# Patient Record
Sex: Male | Born: 1970
Health system: Southern US, Community
[De-identification: ages and names within clinical notes are randomized; demographics above are authoritative.]

## PROBLEM LIST (undated history)

## (undated) DIAGNOSIS — Z9289 Personal history of other medical treatment: Secondary | ICD-10-CM

## (undated) DIAGNOSIS — N4341 Spermatocele of epididymis, single: Secondary | ICD-10-CM

## (undated) DIAGNOSIS — L309 Dermatitis, unspecified: Secondary | ICD-10-CM

## (undated) DIAGNOSIS — T7840XA Allergy, unspecified, initial encounter: Secondary | ICD-10-CM

## (undated) HISTORY — PX: SKIN BIOPSY: SHX1

## (undated) HISTORY — DX: Spermatocele of epididymis, single: N43.41

## (undated) HISTORY — DX: Dermatitis, unspecified: L30.9

## (undated) HISTORY — PX: OSTEOTOMY METACARPAL / FINGER: SUR983

## (undated) HISTORY — DX: Allergy, unspecified, initial encounter: T78.40XA

## (undated) HISTORY — DX: Personal history of other medical treatment: Z92.89

---
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
## 1998-01-17 ENCOUNTER — Emergency Department (HOSPITAL_COMMUNITY): Admission: EM | Admit: 1998-01-17 | Discharge: 1998-01-17 | Payer: Self-pay | Admitting: Emergency Medicine

## 1998-12-09 ENCOUNTER — Emergency Department: Admission: EM | Admit: 1998-12-09 | Discharge: 1998-12-09 | Payer: Self-pay | Admitting: Emergency Medicine

## 1999-07-30 ENCOUNTER — Emergency Department (HOSPITAL_COMMUNITY): Admission: EM | Admit: 1999-07-30 | Discharge: 1999-07-30 | Payer: Self-pay | Admitting: Emergency Medicine

## 1999-10-13 HISTORY — PX: LACERATION REPAIR: SHX5168

## 2010-10-12 DIAGNOSIS — Z9289 Personal history of other medical treatment: Secondary | ICD-10-CM

## 2010-10-12 HISTORY — DX: Personal history of other medical treatment: Z92.89

## 2011-07-27 ENCOUNTER — Ambulatory Visit (INDEPENDENT_AMBULATORY_CARE_PROVIDER_SITE_OTHER): Payer: 59 | Admitting: Medical

## 2011-07-27 ENCOUNTER — Encounter: Payer: Self-pay | Admitting: Medical

## 2011-07-27 DIAGNOSIS — Z0000 Encounter for general adult medical examination without abnormal findings: Secondary | ICD-10-CM

## 2011-07-27 DIAGNOSIS — R0789 Other chest pain: Secondary | ICD-10-CM

## 2011-07-27 LAB — POCT URINALYSIS DIPSTICK
Bilirubin, UA: NEGATIVE
Blood, UA: NEGATIVE
Glucose, UA: NEGATIVE
Nitrite, UA: NEGATIVE
Spec Grav, UA: 1

## 2011-07-27 NOTE — Patient Instructions (Signed)
Preventative Care for Adults, Male       REGULAR HEALTH EXAMS:  A routine yearly physical is a good way to check in with your primary care provider about your health and preventive screening. It is also an opportunity to share updates about your health and any concerns you have, and receive a thorough all-over exam.   Most health insurance companies pay for at least some preventative services.  Check with your health plan for specific coverages.  WHAT PREVENTATIVE SERVICES DO MEN NEED?  Adult men should have their weight and blood pressure checked regularly.   Men age 35 and older should have their cholesterol levels checked regularly.  Beginning at age 50 and continuing to age 75, men should be screened for colorectal cancer.  Certain people should may need continued testing until age 85.  Other cancer screening may include exams for testicular and prostate cancer.  Updating vaccinations is part of preventative care.  Vaccinations help protect against diseases such as the flu.  Lab tests are generally done as part of preventative care to screen for anemia and blood disorders, to screen for problems with the kidneys and liver, to screen for bladder problems, to check blood sugar, and to check your cholesterol level.  Preventative services generally include counseling about diet, exercise, avoiding tobacco, drugs, excessive alcohol consumption, and sexually transmitted infections.    GENERAL RECOMMENDATIONS FOR GOOD HEALTH:  Healthy diet:  Eat a variety of foods, including fruit, vegetables, animal or vegetable protein, such as meat, fish, chicken, and eggs, or beans, lentils, tofu, and grains, such as rice.  Drink plenty of water daily.  Decrease saturated fat in the diet, avoid lots of red meat, processed foods, sweets, fast foods, and fried foods.  Exercise:  Aerobic exercise helps maintain good heart health. At least 30-40 minutes of moderate-intensity exercise is recommended.  For example, a brisk walk that increases your heart rate and breathing. This should be done on most days of the week.   Find a type of exercise or a variety of exercises that you enjoy so that it becomes a part of your daily life.  Examples are running, walking, swimming, water aerobics, and biking.  For motivation and support, explore group exercise such as aerobic class, spin class, Zumba, Yoga,or  martial arts, etc.    Set exercise goals for yourself, such as a certain weight goal, walk or run in a race such as a 5k walk/run.  Speak to your primary care provider about exercise goals.  Disease prevention:  If you smoke or chew tobacco, find out from your caregiver how to quit. It can literally save your life, no matter how long you have been a tobacco user. If you do not use tobacco, never begin.   Maintain a healthy diet and normal weight. Increased weight leads to problems with blood pressure and diabetes.   The Body Mass Index or BMI is a way of measuring how much of your body is fat. Having a BMI above 27 increases the risk of heart disease, diabetes, hypertension, stroke and other problems related to obesity. Your caregiver can help determine your BMI and based on it develop an exercise and dietary program to help you achieve or maintain this important measurement at a healthful level.  High blood pressure causes heart and blood vessel problems.  Persistent high blood pressure should be treated with medicine if weight loss and exercise do not work.   Fat and cholesterol leaves deposits in your arteries   that can block them. This causes heart disease and vessel disease elsewhere in your body.  If your cholesterol is found to be high, or if you have heart disease or certain other medical conditions, then you may need to have your cholesterol monitored frequently and be treated with medication.   Ask if you should have a stress test if your history suggests this. A stress test is a test done on  a treadmill that looks for heart disease. This test can find disease prior to there being a problem.  Avoid drinking alcohol in excess (more than two drinks per day).  Avoid use of street drugs. Do not share needles with anyone. Ask for professional help if you need assistance or instructions on stopping the use of alcohol, cigarettes, and/or drugs.  Brush your teeth twice a day with fluoride toothpaste, and floss once a day. Good oral hygiene prevents tooth decay and gum disease. The problems can be painful, unattractive, and can cause other health problems. Visit your dentist for a routine oral and dental check up and preventive care every 6-12 months.   Look at your skin regularly.  Use a mirror to look at your back. Notify your caregivers of changes in moles, especially if there are changes in shapes, colors, a size larger than a pencil eraser, an irregular border, or development of new moles.  Safety:  Use seatbelts 100% of the time, whether driving or as a passenger.  Use safety devices such as hearing protection if you work in environments with loud noise or significant background noise.  Use safety glasses when doing any work that could send debris in to the eyes.  Use a helmet if you ride a bike or motorcycle.  Use appropriate safety gear for contact sports.  Talk to your caregiver about gun safety.  Use sunscreen with a SPF (or skin protection factor) of 15 or greater.  Lighter skinned people are at a greater risk of skin cancer. Don't forget to also wear sunglasses in order to protect your eyes from too much damaging sunlight. Damaging sunlight can accelerate cataract formation.   Practice safe sex. Use condoms. Condoms are used for birth control and to help reduce the spread of sexually transmitted infections (or STIs).  Some of the STIs are gonorrhea (the clap), chlamydia, syphilis, trichomonas, herpes, HPV (human papilloma virus) and HIV (human immunodeficiency virus) which causes AIDS.  The herpes, HIV and HPV are viral illnesses that have no cure. These can result in disability, cancer and death.   Keep carbon monoxide and smoke detectors in your home functioning at all times. Change the batteries every 6 months or use a model that plugs into the wall.   Vaccinations:  Stay up to date with your tetanus shots and other required immunizations. You should have a booster for tetanus every 10 years. Be sure to get your flu shot every year, since 5%-20% of the [place] population comes down with the flu. The flu vaccine changes each year, so being vaccinated once is not enough. Get your shot in the fall, before the flu season peaks.   Other vaccines to consider:  Pneumococcal vaccine to protect against certain types of pneumonia.  This is normally recommended for adults age 65 or older.  However, adults younger than 38 years old with certain underlying conditions such as diabetes, heart or lung disease should also receive the vaccine.  Shingles vaccine to protect against Varicella Zoster if you are older than age 60, or younger   than 38 years old with certain underlying illness.  Hepatitis A vaccine to protect against a form of infection of the liver by a virus acquired from food.  Hepatitis B vaccine to protect against a form of infection of the liver by a virus acquired from blood or body fluids, particularly if you work in health care.  If you plan to travel internationally, check with your local health department for specific vaccination recommendations.  Cancer Screening:  Most routine colon cancer screening begins at the age of 50. On a yearly basis, doctors may provide special easy to use take-home tests to check for hidden blood in the stool. Sigmoidoscopy or colonoscopy can detect the earliest forms of colon cancer and is life saving. These tests use a small camera at the end of a tube to directly examine the colon. Speak to your caregiver about this at age 50, when routine  screening begins (and is repeated every 5 years unless early forms of pre-cancerous polyps or small growths are found).   At the age of 50 men usually start screening for prostate cancer every year. Screening may begin at a younger age for those with higher risk. Those at higher risk include African-Americans or having a family history of prostate cancer. There are two types of tests for prostate cancer:   Prostate-specific antigen (PSA) testing. Recent studies raise questions about prostate cancer using PSA and you should discuss this with your caregiver.   Digital rectal exam (in which your doctor's lubricated and gloved finger feels for enlargement of the prostate through the anus).   Screening for testicular cancer.  Do a monthly exam of your testicles. Gently roll each testicle between your thumb and fingers, feeling for any abnormal lumps. The best time to do this is after a hot shower or bath when the tissues are looser. Notify your caregivers of any lumps, tenderness or changes in size or shape immediately.     

## 2011-07-27 NOTE — Progress Notes (Signed)
Subjective:   HPI  Oscar Ellis is a 40 y.o. male who presents for a complete physical.  He is a new patient today.  In general has been in normal state of health.  He  recently had a flu vaccine through work, and last Tdap 2010.  Last physical was several years ago.  He has primarily went to urgent care for medical issues prior.    He hasn't really had any issues.  However, last week he had a big test through work.  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  He notes some chest tightness that lasted for a few days, intermittent.  He had no other symptoms.  The symptoms resolved a few days later and haven't recurred. In general he exercises without difficulty.   He does note hx/o normal EKG 2005.  He denies hx/o heart disease.  He does note prior total cholesterol of 250.  Reviewed their medical, surgical, family, social, medication, and allergy history and updated chart as appropriate.  Past Medical History  Diagnosis Date  . Eczema     Dr. Terri Piedra  . Allergy   . Asthma     mild, worse as teenager    Past Surgical History  Procedure Date  . Laceration repair 2001    left forehead  . Appendectomy 1989  . Skin biopsy     Dr. Terri Piedra    Family History  Problem Relation Age of Onset  . Inflammatory bowel disease Sister   . COPD Maternal Grandfather     black lung  . Heart disease Neg Hx   . Stroke Neg Hx   . Diabetes Neg Hx   . Hypertension Neg Hx   . Hyperlipidemia Neg Hx   . Cancer Neg Hx     History   Social History  . Marital Status: Married    Spouse Name: N/A    Number of Children: N/A  . 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 Smoking status: Never Smoker   . Smokeless tobacco: Never Used  . Alcohol Use: 1.0 oz/week    2 drink(s) per week  . Drug Use: No  . Sexually Active: Not on file   Other Topics Concern  . Not on file   Social History Narrative   Married, 1  child, son 55 years old, exercise - walking on the job, 3 sets of calisthenics, runs some.  Plays poker.  Catholic.     No Known Allergies   Review of Systems Constitutional: denies fever, chills, sweats, unexpected weight change, anorexia, fatigue Allergy: negative; denies recent sneezing, itching, congestion Dermatology: denies changing moles, rash, lumps, new worrisome lesions ENT: no runny nose, ear pain, sore throat, hoarseness, sinus pain, teeth pain, tinnitus, hearing loss, epistaxis Cardiology: denies chest pain, palpitations, edema, orthopnea, paroxysmal nocturnal dyspnea Respiratory: denies cough, shortness of breath, dyspnea on exertion, wheezing, hemoptysis Gastroenterology: denies abdominal pain, nausea, vomiting, diarrhea, constipation, blood in stool, changes in bowel movement, dysphagia Hematology: denies bleeding or bruising problems Musculoskeletal: +hx/o 2 prior left clavicle fractures, hx/o several prior fractures;  Gets occasional pain in left clavicle pain from time to time.  Denies myalgias, joint swelling, back pain, neck pain, cramping, gait changes Ophthalmology: denies vision changes, eye redness, itching, discharge Urology: denies dysuria, difficulty urinating, hematuria, urinary frequency, urgency, incontinence Neurology: no headache, weakness, tingling, numbness, speech abnormality, memory loss, falls, dizziness Psychology:  denies depressed mood, agitation, sleep problems     Objective:   Physical Exam  Filed Vitals:   07/27/11 1058  BP: 100/70  Pulse: 62  Temp: 98 F (36.7 C)  Resp: 16    General appearance: alert, no distress, WD/WN, white male Skin; faint brownish uniform 10cm x 6cm birth mark of posterior right leg, popliteal area, otherwise, few scattered benign appearing macules HEENT: normocephalic, conjunctiva/corneas normal, sclerae anicteric, PERRLA, EOMi, nares patent, no discharge or erythema, pharynx normal Oral cavity: MMM, tongue  normal, teeth in good repair Neck: supple, no lymphadenopathy, no thyromegaly, no masses, normal ROM, no bruits Chest: non tender, normal shape and expansion Heart: RRR, normal S1, S2, no murmurs Lungs: CTA bilaterally, no wheezes, rhonchi, or rales Abdomen: +bs, soft, non tender, non distended, no masses, no hepatomegaly, no splenomegaly, no bruits Back: non tender, normal ROM, no scoliosis Musculoskeletal: upper extremities non tender, no obvious deformity, normal ROM throughout, lower extremities non tender, no obvious deformity, normal ROM throughout Extremities: no edema, no cyanosis, no clubbing Pulses: 2+ symmetric, upper and lower extremities, normal cap refill Neurological: alert, oriented x 3, CN2-12 intact, strength normal upper extremities and lower extremities, sensation normal throughout, DTRs 2+ throughout, no cerebellar signs, gait normal Psychiatric: normal affect, behavior normal, pleasant  GU: normal external male genitalia, no mass, no hernia, left penis and glans with faint light brown uniform patch, which patient noted to be unchanged birth mark   Assessment :    Encounter Diagnoses  Name Primary?  . General medical examination Yes  . Chest tightness      Plan:    Physical exam - discussed healthy lifestyle, diet, exercise, preventative care, vaccinations, and addressed their concerns.  Labs today.  Handout given.  Chest tightness - likely anxiety related. Discussed EKG with supervising physician.  EKG shows some QRS widening in III and precordial leads, sinus bradycardia, and early repolarization.  Will call cardiology to review EKG and symptoms in the event he may need additional eval.   After speaking to cardiology about the EKG findings and symptoms, they recommended eval.  Thus, will refer to Dr. Allyson Sabal at [facility] for further eval.

## 2011-07-28 DIAGNOSIS — R0789 Other chest pain: Secondary | ICD-10-CM

## 2011-07-28 DIAGNOSIS — Z0000 Encounter for general adult medical examination without abnormal findings: Secondary | ICD-10-CM | POA: Insufficient documentation

## 2011-07-28 HISTORY — DX: Other chest pain: R07.89

## 2011-07-28 LAB — CBC WITH DIFFERENTIAL/PLATELET
Basophils Absolute: 0 10*3/uL (ref 0.0–0.1)
Basophils Relative: 1 % (ref 0–1)
Hemoglobin: 15.9 g/dL (ref 13.0–17.0)
MCHC: 34.6 g/dL (ref 30.0–36.0)
Neutro Abs: 3 10*3/uL (ref 1.7–7.7)
Neutrophils Relative %: 53 % (ref 43–77)
RDW: 12.9 % (ref 11.5–15.5)

## 2011-07-28 LAB — COMPREHENSIVE METABOLIC PANEL
ALT: 33 U/L (ref 0–53)
AST: 29 U/L (ref 0–37)
Albumin: 4.8 g/dL (ref 3.5–5.2)
BUN: 9 mg/dL (ref 6–23)
CO2: 27 mEq/L (ref 19–32)
Calcium: 9.6 mg/dL (ref 8.4–10.5)
Chloride: 100 mEq/L (ref 96–112)
Potassium: 4.2 mEq/L (ref 3.5–5.3)

## 2011-07-28 LAB — LIPID PANEL
Cholesterol: 204 mg/dL — ABNORMAL HIGH (ref 0–200)
Triglycerides: 82 mg/dL (ref <150)

## 2012-05-14 ENCOUNTER — Ambulatory Visit (INDEPENDENT_AMBULATORY_CARE_PROVIDER_SITE_OTHER): Payer: 59 | Admitting: Family Medicine

## 2012-05-14 ENCOUNTER — Ambulatory Visit: Payer: 59

## 2012-05-14 VITALS — BP 118/73 | HR 55 | Temp 97.7°F | Resp 16 | Ht 72.0 in | Wt 178.0 lb

## 2012-05-14 DIAGNOSIS — M79642 Pain in left hand: Secondary | ICD-10-CM

## 2012-05-14 DIAGNOSIS — M79609 Pain in unspecified limb: Secondary | ICD-10-CM

## 2012-05-14 NOTE — Progress Notes (Signed)
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  He complains of left hand pain at 4th MCP joint after "snagging" finger on Thursday and developing pain, black and blue, and swelling at left ring finger mcp joint.  Objective: marked ecchymosis in the palm and dorsal hand in the region of the 4th left mcp joint Slow but complete flexion Minimal bony abnormality, but tender at that mcp joint [facility] reading (PRIMARY) by  Dr. Milus Glazier:  Left hand-slight irregularity reviewed with radiologist and thought to not be a fracture  Assessment.  Severe strain of MCP joint, left 4th MCP  Plan:  Ulnar gutter splint x 10 days

## 2012-05-14 NOTE — Patient Instructions (Signed)
Splint 10 days, recheck early am of August 13, 7:30am

## 2012-08-29 ENCOUNTER — Ambulatory Visit (INDEPENDENT_AMBULATORY_CARE_PROVIDER_SITE_OTHER): Payer: 59 | Admitting: Medical

## 2012-08-29 ENCOUNTER — Encounter: Payer: Self-pay | Admitting: Medical

## 2012-08-29 VITALS — BP 118/78 | HR 76 | Temp 98.1°F | Resp 16 | Ht 72.0 in | Wt 176.0 lb

## 2012-08-29 DIAGNOSIS — L309 Dermatitis, unspecified: Secondary | ICD-10-CM

## 2012-08-29 DIAGNOSIS — Z23 Encounter for immunization: Secondary | ICD-10-CM

## 2012-08-29 DIAGNOSIS — Z0000 Encounter for general adult medical examination without abnormal findings: Secondary | ICD-10-CM

## 2012-08-29 DIAGNOSIS — L259 Unspecified contact dermatitis, unspecified cause: Secondary | ICD-10-CM

## 2012-08-29 LAB — POCT URINALYSIS DIPSTICK
Bilirubin, UA: NEGATIVE
Ketones, UA: NEGATIVE
Leukocytes, UA: NEGATIVE
Protein, UA: NEGATIVE

## 2012-08-29 NOTE — Progress Notes (Signed)
Subjective:   HPI  Oscar Ellis is a 41 y.o. male who presents for a complete physical.  Saw me a year ago for physical.   Overall been in good health. No major issues.  He does report recently feeling odd prominence at the xiphoid process.  Not painful, just notices it recently.   Preventative care: Last ophthalmology visit:N/A Last dental visit:YES Last colonoscopy:N/A Last prostate exam: N/A Last EKG:07/2011 Last labs:07/2011  Prior vaccinations: TD or Tdap: 2010 Influenza: today Pneumococcal:N/A Shingles/Zostavax:N/A Other: N//A  Reviewed their medical, surgical, family, social, medication, and allergy history and updated chart as appropriate.   Past Medical History  Diagnosis Date  . Eczema     Dr. Terri Piedra  . Allergy   . Asthma     mild, worse as teenager  . H/O echocardiogram 2012    EKG abnormal; eval with echo and holter normal, cardiology, Dr. Allyson Sabal [facility]  . Spermatocele of epididymis, single     right    Past Surgical History  Procedure Date  . Laceration repair 2001    left forehead  . Appendectomy 1989  . Skin biopsy     Dr. Terri Piedra    Family History  Problem Relation Age of Onset  . Inflammatory bowel disease Sister   . COPD Maternal Grandfather     black lung  . Heart disease Neg Hx   . Stroke Neg Hx   . Diabetes Neg Hx   . Hypertension Neg Hx   . Hyperlipidemia Neg Hx   . Cancer Neg Hx   . Dementia Maternal Grandmother     History   Social History  . Marital Status: Married    Spouse Name: N/A    Number of Children: N/A  . 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 Smoking status: Never Smoker   . Smokeless tobacco: Never Used  . Alcohol Use: 0.5 oz/week    1 drink(s) per week  . Drug Use: No  . Sexually Active: Not on file   Other Topics Concern  . Not on file   Social History Narrative   Married, 1 child, son 48 years old, exercise - walking on the  job, 3 sets of calisthenics, runs some.  Plays poker.  Catholic.  Emergency planning/management officer.    Current Outpatient Prescriptions on File Prior to Visit  Medication Sig Dispense Refill  . flurandrenolide (CORDRAN) 0.05 % lotion Apply topically as needed.          Allergies  Allergen Reactions  . Neosporin (Neomycin-Bacitracin Zn-Polymyx)     rash     Review of Systems Constitutional: -fever, -chills, -sweats, -unexpected weight change, -decreased appetite, -fatigue Allergy: -sneezing, -itching, -congestion Dermatology: -changing moles, --rash, +Lumps ENT: -runny nose, -ear pain, -sore throat, -hoarseness, -sinus pain, -teeth pain, - ringing in ears, -hearing loss, -nosebleeds Cardiology: -chest pain, -palpitations, -swelling, -difficulty breathing when lying flat, -waking up short of breath Respiratory: -cough, -shortness of breath, -difficulty breathing with exercise or exertion, -wheezing, -coughing up blood Gastroenterology: -abdominal pain, -nausea, -vomiting, -diarrhea, -constipation, -blood in stool, -changes in bowel movement, -difficulty swallowing or eating Hematology: -bleeding, -bruising  Musculoskeletal: -joint aches, -muscle aches, -joint swelling, -back pain, -neck pain, -cramping, -changes in gait Ophthalmology: denies vision changes, eye redness, itching, discharge Urology: -burning with urination, -difficulty urinating, -blood in urine, -urinary frequency, -urgency, -incontinence Neurology: -headache, -weakness, -tingling, -numbness, -memory loss, -falls, -dizziness Psychology: -depressed mood, -  agitation, -sleep problems     Objective:   Physical Exam  Filed Vitals:   08/29/12 1354  BP: 118/78  Temp: 98.1 F (36.7 C)   General appearance: alert, no distress, WD/WN, white male Skin; faint brownish uniform 10cm x 6cm birth mark of posterior right leg, popliteal area, otherwise, few scattered benign appearing macules, epigastric region of abdomen with 1.2cm x 3mm  horizontal raised erythematous lesion c/w scar tissue/slight keloidal appearance of tissue from prior biopsy, scattered few benign appearing macules throughout.  Posterior lower right leg with oval 8cm x 5 cm patch of rough skin c/w eczema HEENT: normocephalic, conjunctiva/corneas normal, sclerae anicteric, PERRLA, EOMi, nares patent, no discharge or erythema, pharynx normal Oral cavity: MMM, tongue normal, teeth in good repair Neck: supple, no lymphadenopathy, no thyromegaly, no masses, normal ROM, no bruits Chest: non tender, normal shape and expansion, xiphoid normal to palpation, nontender Heart: RRR, normal S1, S2, no murmurs Lungs: CTA bilaterally, no wheezes, rhonchi, or rales Abdomen: +bs, soft, RLQ surgical scar, non tender, non distended, no masses, no hepatomegaly, no splenomegaly, no bruits Back: non tender, normal ROM, no scoliosis Musculoskeletal: upper extremities non tender, no obvious deformity, normal ROM throughout, lower extremities non tender, no obvious deformity, normal ROM throughout Extremities: no edema, no cyanosis, no clubbing Pulses: 2+ symmetric, upper and lower extremities, normal cap refill Neurological: alert, oriented x 3, CN2-12 intact, strength normal upper extremities and lower extremities, sensation normal throughout, DTRs 2+ throughout, no cerebellar signs, gait normal Psychiatric: normal affect, behavior normal, pleasant  GU: normal external male genitalia, right round 3mm diameter mass in scrotum superior to testicle c/w spermatocele, otherwise no mass, no hernia, left penis and glans with faint light brown uniform patch, which patient noted to be unchanged birth mark Rectal: deferred    Assessment and Plan :      Encounter Diagnoses  Name Primary?  . Routine general medical examination at a health care facility Yes  . Need for prophylactic vaccination and inoculation against influenza   . Eczema     Physical exam - discussed healthy lifestyle,  diet, exercise, preventative care, vaccinations, and addressed their concerns.  Reassured that xiphoid appears normal.  Reviewed last years's labs, cardiology evaluation, and no indication for labs or other evaluation at this time.  discussed monthly testicular exams.    Eczema - daily moisturizing lotion, c/t current steroidal cream for flare up but not daily  Flu vaccine, VIS and counseling given.

## 2014-03-20 ENCOUNTER — Ambulatory Visit (INDEPENDENT_AMBULATORY_CARE_PROVIDER_SITE_OTHER): Payer: 59 | Admitting: Medical

## 2014-03-20 ENCOUNTER — Encounter: Payer: Self-pay | Admitting: Medical

## 2014-03-20 VITALS — BP 102/78 | HR 58 | Temp 98.0°F | Resp 14 | Ht 72.0 in | Wt 187.0 lb

## 2014-03-20 DIAGNOSIS — Z0000 Encounter for general adult medical examination without abnormal findings: Secondary | ICD-10-CM

## 2014-03-20 DIAGNOSIS — Z23 Encounter for immunization: Secondary | ICD-10-CM

## 2014-03-20 LAB — POCT URINALYSIS DIPSTICK
BILIRUBIN UA: NEGATIVE
Blood, UA: NEGATIVE
GLUCOSE UA: NEGATIVE
KETONES UA: NEGATIVE
LEUKOCYTES UA: NEGATIVE
Nitrite, UA: NEGATIVE
PH UA: 5
Spec Grav, UA: 1.015
Urobilinogen, UA: NEGATIVE

## 2014-03-20 LAB — COMPREHENSIVE METABOLIC PANEL
ALBUMIN: 4.5 g/dL (ref 3.5–5.2)
ALT: 27 U/L (ref 0–53)
AST: 23 U/L (ref 0–37)
Alkaline Phosphatase: 92 U/L (ref 39–117)
BUN: 9 mg/dL (ref 6–23)
CALCIUM: 9.1 mg/dL (ref 8.4–10.5)
CHLORIDE: 100 meq/L (ref 96–112)
CO2: 28 meq/L (ref 19–32)
Creat: 0.84 mg/dL (ref 0.50–1.35)
Glucose, Bld: 87 mg/dL (ref 70–99)
POTASSIUM: 3.9 meq/L (ref 3.5–5.3)
SODIUM: 137 meq/L (ref 135–145)
TOTAL PROTEIN: 6.9 g/dL (ref 6.0–8.3)
Total Bilirubin: 1.5 mg/dL — ABNORMAL HIGH (ref 0.2–1.2)

## 2014-03-20 LAB — CBC
HCT: 41.6 % (ref 39.0–52.0)
HEMOGLOBIN: 14.6 g/dL (ref 13.0–17.0)
MCH: 31.4 pg (ref 26.0–34.0)
MCHC: 35.1 g/dL (ref 30.0–36.0)
MCV: 89.5 fL (ref 78.0–100.0)
Platelets: 248 10*3/uL (ref 150–400)
RBC: 4.65 MIL/uL (ref 4.22–5.81)
RDW: 13.2 % (ref 11.5–15.5)
WBC: 5.9 10*3/uL (ref 4.0–10.5)

## 2014-03-20 LAB — LIPID PANEL
CHOLESTEROL: 221 mg/dL — AB (ref 0–200)
HDL: 42 mg/dL (ref >39)
LDL Cholesterol: 151 mg/dL — ABNORMAL HIGH (ref 0–99)
Total CHOL/HDL Ratio: 5.3 Ratio
Triglycerides: 140 mg/dL (ref <150)
VLDL: 28 mg/dL (ref 0–40)

## 2014-03-20 NOTE — Addendum Note (Signed)
Addended by: Armanda Magic on: 03/20/2014 09:34 AM   Modules accepted: Orders

## 2014-03-20 NOTE — Patient Instructions (Signed)
  Thank you for giving me the opportunity to serve you today.    Your diagnosis today includes: Encounter Diagnoses  Name Primary?  . Routine general medical examination at a health care facility Yes  . Need for Tdap vaccination      Specific recommendations today include:  We updated your Tdap vaccine today which includes tetanus diphtheria and pertussis  Use some Benadryl by mouth and hydrocortisone cream topically for the insect bite.  If worse signs of infection such as pain, redness, fever then let me know  Continue healthy diet and routine exercise  We will call with lab results  Consider baseline eye doctor visit  See dentist yearly  We will plan a colonoscopy at age 17  Continue monthly testicular exams for testicle cancer screening  Return pending labs.

## 2014-03-20 NOTE — Progress Notes (Signed)
Subjective:   HPI  Oscar Ellis is a 43 y.o. male who presents for a complete physical.  Preventative care: Last ophthalmology visit:n/a Last dental visit:yes Dr. Ronnald Ramp Last colonoscopy: never Last prostate exam: ? Last EKG:07/28/2011 Last labs:2012  Prior vaccinations: TD or Tdap: <5 years ago Influenza:09/2013 Pneumococcal:n/a Shingles/Zostavax:n/a  Advanced directive:n/a Health care power of attorney:n/a Living will:n/a  Concerns: Asthma - no issues.   Mostly childhood, has no problems with this now.   He notes a lump at the xiphoid process, curious about this.  Exercise - 3 days per week running and weight lifting for an hour.    Reviewed their medical, surgical, family, social, medication, and allergy history and updated chart as appropriate.  Past Medical History  Diagnosis Date  . Eczema     Dr. Allyson Sabal  . Allergy   . Asthma     mild, worse as teenager  . H/O echocardiogram 2012    had chest pain/stress at that time, EKG abnormal; eval with echo and holter normal, cardiology, Dr. Gwenlyn Found [facility]  . Spermatocele of epididymis, single     right    Past Surgical History  Procedure Laterality Date  . Laceration repair  2001    left forehead  . Appendectomy  1989  . Skin biopsy      Dr. Allyson Sabal    History   Social History  . Marital Status: Married    Spouse Name: N/A    Number of Children: N/A  . Years of Education: N/A   Occupational History  . Police officer Unemployed   Social History Main Topics  . Smoking status: Never Smoker   . Smokeless tobacco: Never Used  . Alcohol Use: 0.5 oz/week    1 drink(s) per week  . Drug Use: No  . Sexual Activity: Not on file   Other Topics Concern  . Not on file   Social History Narrative   Married, 1 child, son 71 years old with ADD, exercise - walking on the job, 3 sets of calisthenics, runs some.  Plays poker.  Catholic.  Engineer, structural.    Family History  Problem Relation Age of Onset  .  Inflammatory bowel disease Sister   . COPD Maternal Grandfather     black lung  . Heart disease Neg Hx   . Stroke Neg Hx   . Diabetes Neg Hx   . Hypertension Neg Hx   . Hyperlipidemia Neg Hx   . Cancer Neg Hx   . Dementia Maternal Grandmother     Current outpatient prescriptions:desloratadine (CLARINEX) 5 MG tablet, Take 5 mg by mouth daily., Disp: , Rfl: ;  Multiple Vitamins-Minerals (MULTIVITAMIN WITH MINERALS) tablet, Take 1 tablet by mouth daily., Disp: , Rfl:   Allergies  Allergen Reactions  . Neosporin [Neomycin-Bacitracin Zn-Polymyx]     rash     Review of Systems Constitutional: -fever, -chills, -sweats, -unexpected weight change, -decreased appetite, -fatigue Allergy: -sneezing, -itching, -congestion Dermatology: -changing moles, --rash,+-lumps ENT: -runny nose, -ear pain, -sore throat, -hoarseness, -sinus pain, -teeth pain, - ringing in ears, -hearing loss, -nosebleeds Cardiology: -chest pain, -palpitations, -swelling, -difficulty breathing when lying flat, -waking up short of breath Respiratory: -cough, -shortness of breath, -difficulty breathing with exercise or exertion, -wheezing, -coughing up blood Gastroenterology: -abdominal pain, -nausea, -vomiting, -diarrhea, -constipation, -blood in stool, -changes in bowel movement, -difficulty swallowing or eating Hematology: -bleeding, -bruising  Musculoskeletal: -joint aches, -muscle aches, -joint swelling, -back pain, -neck pain, -cramping, -changes in gait Ophthalmology: denies vision changes,  eye redness, itching, discharge Urology: -burning with urination, -difficulty urinating, -blood in urine, -urinary frequency, -urgency, -incontinence Neurology: -headache, -weakness, -tingling, -numbness, -memory loss, -falls, -dizziness Psychology: -depressed mood, -agitation, -sleep problems     Objective:   Physical Exam  BP 102/78  Pulse 58  Temp(Src) 98 F (36.7 C) (Oral)  Resp 14  Ht 6' (1.829 m)  Wt 187 lb (84.823  kg)  BMI 25.36 kg/m2  General appearance: alert, no distress, WD/WN, white male Skin: left low back with 2cm raised urticarial lesion, c/w insect bite, not infected, no worrisome lesions, old biopsy scar abdomen centrally just left of midline, and similar biopsy scar right low back HEENT: normocephalic, conjunctiva/corneas normal, sclerae anicteric, PERRLA, EOMi, nares patent, no discharge or erythema, pharynx normal Oral cavity: MMM, tongue normal, teeth normal Neck: supple, no lymphadenopathy, no thyromegaly, no masses, normal ROM, no bruits Chest: non tender, normal shape and expansion Heart: RRR, normal S1, S2, no murmurs Lungs: CTA bilaterally, no wheezes, rhonchi, or rales Abdomen: +bs, soft, non tender, non distended, no masses, no hepatomegaly, no splenomegaly, no bruits Back: non tender, normal ROM, no scoliosis Musculoskeletal: upper extremities non tender, no obvious deformity, normal ROM throughout, lower extremities non tender, no obvious deformity, normal ROM throughout Extremities: no edema, no cyanosis, no clubbing Pulses: 2+ symmetric, upper and lower extremities, normal cap refill Neurological: alert, oriented x 3, CN2-12 intact, strength normal upper extremities and lower extremities, sensation normal throughout, DTRs 2+ throughout, no cerebellar signs, gait normal Psychiatric: normal affect, behavior normal, pleasant  GU: normal male external genitalia, circumcised, nontender, no masses, no hernia, no lymphadenopathy Rectal: deferred   Assessment and Plan :    Encounter Diagnoses  Name Primary?  . Routine general medical examination at a health care facility Yes  . Need for Tdap vaccination     Physical exam - discussed healthy lifestyle, diet, exercise, preventative care, vaccinations, and addressed their concerns.  routine labs today.  Reviewed last labs. Advised baseline ophthalmology exam.  See dentist yearly.  Advised testicular exam monthly.  Reassured about  the xiphoid process.  Counseled on the Tdap (tetanus, diptheria, and acellular pertussis) vaccine.  Vaccine information sheet given. Tdap vaccine given after consent obtained. Follow-up pending labs

## 2015-03-27 ENCOUNTER — Ambulatory Visit (INDEPENDENT_AMBULATORY_CARE_PROVIDER_SITE_OTHER): Payer: 59 | Admitting: Medical

## 2015-03-27 ENCOUNTER — Encounter: Payer: Self-pay | Admitting: Medical

## 2015-03-27 VITALS — BP 100/80 | HR 44 | Temp 98.5°F | Resp 15 | Ht 72.0 in | Wt 178.0 lb

## 2015-03-27 DIAGNOSIS — Z0000 Encounter for general adult medical examination without abnormal findings: Secondary | ICD-10-CM | POA: Diagnosis not present

## 2015-03-27 DIAGNOSIS — R001 Bradycardia, unspecified: Secondary | ICD-10-CM

## 2015-03-27 DIAGNOSIS — E785 Hyperlipidemia, unspecified: Secondary | ICD-10-CM

## 2015-03-27 LAB — POCT URINALYSIS DIPSTICK
Bilirubin, UA: NEGATIVE
GLUCOSE UA: NEGATIVE
KETONES UA: NEGATIVE
Leukocytes, UA: NEGATIVE
Nitrite, UA: NEGATIVE
RBC UA: NEGATIVE
SPEC GRAV UA: 1.025
Urobilinogen, UA: NEGATIVE
pH, UA: 6

## 2015-03-27 LAB — GLUCOSE, RANDOM: Glucose, Bld: 84 mg/dL (ref 70–99)

## 2015-03-27 LAB — TSH: TSH: 2.087 u[IU]/mL (ref 0.350–4.500)

## 2015-03-27 NOTE — Progress Notes (Signed)
Subjective:   HPI  Oscar Ellis is a 44 y.o. male who presents for a complete physical.  Preventative care: Last ophthalmology visit: n/a Last dental visit:yes Dr. Ronnald Ramp Last colonoscopy:n/a Last prostate exam:  Last EKG: 2012 Last labs:03/2014  Prior vaccinations: TD or Tdap:2015 Influenza: yearly through work Pneumococcal:n/a Shingles/Zostavax:n/a  Concerns: none  Reviewed their medical, surgical, family, social, medication, and allergy history and updated chart as appropriate.  Past Medical History  Diagnosis Date  . Eczema     Dr. Allyson Sabal  . Allergy   . Asthma     mild, worse as teenager  . H/O echocardiogram 2012    had chest pain/stress at that time, EKG abnormal; eval with echo and holter normal, cardiology, Dr. Gwenlyn Found [facility]  . Spermatocele of epididymis, single     right    Past Surgical History  Procedure Laterality Date  . Laceration repair  2001    left forehead  . Appendectomy  1989  . Skin biopsy      Dr. Allyson Sabal    History   Social History  . Marital Status: Married    Spouse Name: N/A  . Number of Children: N/A  . Years of Education: N/A   Occupational History  . Police officer Unemployed   Social History Main Topics  . Smoking status: Never Smoker   . Smokeless tobacco: Never Used  . Alcohol Use: 0.5 oz/week    1 Standard drinks or equivalent per week  . Drug Use: No  . Sexual Activity: Not on file   Other Topics Concern  . Not on file   Social History Narrative   Married, 1 child, son 9 years old with ADD, exercise - running, swimming, walking on the job, 3 sets of calisthenics.  Plays poker.  Catholic.  Engineer, structural.    Family History  Problem Relation Age of Onset  . Inflammatory bowel disease Sister   . COPD Maternal Grandfather     black lung  . Heart disease Neg Hx   . Stroke Neg Hx   . Diabetes Neg Hx   . Hypertension Neg Hx   . Hyperlipidemia Neg Hx   . Cancer Neg Hx   . Dementia Maternal Grandmother       Current outpatient prescriptions:  Marland Kitchen  Multiple Vitamins-Minerals (MULTIVITAMIN WITH MINERALS) tablet, Take 1 tablet by mouth daily., Disp: , Rfl:  .  desloratadine (CLARINEX) 5 MG tablet, Take 5 mg by mouth daily., Disp: , Rfl:   Allergies  Allergen Reactions  . Neosporin [Neomycin-Bacitracin Zn-Polymyx]     rash       Review of Systems Constitutional: -fever, -chills, -sweats, -unexpected weight change, -decreased appetite, -fatigue Allergy: -sneezing, -itching, -congestion Dermatology: -changing moles, --rash, -lumps ENT: -runny nose, -ear pain, -sore throat, -hoarseness, -sinus pain, -teeth pain, - ringing in ears, -hearing loss, -nosebleeds Cardiology: -chest pain, -palpitations, -swelling, -difficulty breathing when lying flat, -waking up short of breath Respiratory: -cough, -shortness of breath, -difficulty breathing with exercise or exertion, -wheezing, -coughing up blood Gastroenterology: -abdominal pain, -nausea, -vomiting, -diarrhea, -constipation, -blood in stool, -changes in bowel movement, -difficulty swallowing or eating Hematology: -bleeding, -bruising  Musculoskeletal: -joint aches, -muscle aches, -joint swelling, -back pain, -neck pain, -cramping, -changes in gait Ophthalmology: denies vision changes, eye redness, itching, discharge Urology: -burning with urination, -difficulty urinating, -blood in urine, -urinary frequency, -urgency, -incontinence Neurology: -headache, -weakness, -tingling, -numbness, -memory loss, -falls, -dizziness Psychology: -depressed mood, -agitation, -sleep problems     Objective:   Physical  Exam  BP 100/80 mmHg  Pulse 44  Temp(Src) 98.5 F (36.9 C) (Oral)  Resp 15  Ht 6' (1.829 m)  Wt 178 lb (80.74 kg)  BMI 24.14 kg/m2  General appearance: alert, no distress, WD/WN, white male Skin: scattered macules, no worrisome lesions, old biopsy scar abdomen centrally just left of midline, and similar biopsy scar right low back HEENT:  normocephalic, conjunctiva/corneas normal, sclerae anicteric, PERRLA, EOMi, nares patent, no discharge or erythema, pharynx normal Oral cavity: MMM, tongue normal, teeth normal Neck: supple, no lymphadenopathy, no thyromegaly, no masses, normal ROM, no bruits Chest: non tender, normal shape and expansion Heart: RRR, normal S1, S2, no murmurs Lungs: CTA bilaterally, no wheezes, rhonchi, or rales Abdomen: +bs, soft, non tender, non distended, no masses, no hepatomegaly, no splenomegaly, no bruits Back: non tender, normal ROM, no scoliosis Musculoskeletal: upper extremities non tender, no obvious deformity, normal ROM throughout, lower extremities non tender, no obvious deformity, normal ROM throughout Extremities: no edema, no cyanosis, no clubbing Pulses: 2+ symmetric, upper and lower extremities, normal cap refill Neurological: alert, oriented x 3, CN2-12 intact, strength normal upper extremities and lower extremities, sensation normal throughout, DTRs 2+ throughout, no cerebellar signs, gait normal Psychiatric: normal affect, behavior normal, pleasant  GU: normal male external genitalia, circumcised, linear oval brown patch of dorsal penis and glans unchanged "birth mark" per patient, nontender, no masses, no hernia, no lymphadenopathy Rectal: deferred    Assessment and Plan :    Encounter Diagnoses  Name Primary?  . Encounter for health maintenance examination in adult Yes  . Bradycardia   . Elevated lipids    Physical exam - discussed healthy lifestyle, diet, exercise, preventative care, vaccinations, and addressed their concerns.   reviewed prior EKG.  He is exercising regularly without c/o, been bradycardic for years.  Recheck if problems.   Of note, EKG machine here today is broken, although we were going to get an updated EKG for baseline Labs today Advised yearly flu shot See your eye doctor yearly for routine vision care See your dentist yearly for routine dental care  including hygiene visits twice yearly. Follow-up pending labs

## 2015-03-29 LAB — NMR LIPOPROFILE WITH LIPIDS
CHOLESTEROL, TOTAL: 210 mg/dL — AB (ref 100–199)
HDL Particle Number: 27 umol/L — ABNORMAL LOW (ref >=30.5)
HDL Size: 8.7 nm — ABNORMAL LOW (ref >=9.2)
HDL-C: 45 mg/dL (ref >39)
LARGE HDL: 2.9 umol/L — AB (ref >=4.8)
LDL (calc): 146 mg/dL — ABNORMAL HIGH (ref 0–99)
LDL Particle Number: 1671 nmol/L — ABNORMAL HIGH (ref <1000)
LDL Size: 21 nm (ref >=20.8)
LP-IR SCORE: 39 (ref <=45)
Small LDL Particle Number: 511 nmol/L (ref <=527)
Triglycerides: 96 mg/dL (ref 0–149)
VLDL Size: 34.4 nm (ref <=46.6)

## 2016-08-18 ENCOUNTER — Telehealth: Payer: Self-pay

## 2016-08-18 ENCOUNTER — Ambulatory Visit (INDEPENDENT_AMBULATORY_CARE_PROVIDER_SITE_OTHER): Payer: Commercial Managed Care - HMO | Admitting: Medical

## 2016-08-18 ENCOUNTER — Encounter: Payer: Self-pay | Admitting: Medical

## 2016-08-18 VITALS — BP 108/64 | HR 49 | Wt 177.0 lb

## 2016-08-18 DIAGNOSIS — R448 Other symptoms and signs involving general sensations and perceptions: Secondary | ICD-10-CM

## 2016-08-18 DIAGNOSIS — G4489 Other headache syndrome: Secondary | ICD-10-CM | POA: Diagnosis not present

## 2016-08-18 DIAGNOSIS — L989 Disorder of the skin and subcutaneous tissue, unspecified: Secondary | ICD-10-CM | POA: Diagnosis not present

## 2016-08-18 DIAGNOSIS — R6889 Other general symptoms and signs: Secondary | ICD-10-CM | POA: Diagnosis not present

## 2016-08-18 MED ORDER — AMOXICILLIN 500 MG PO TABS: ORAL_TABLET | ORAL | 0 refills | Status: DC

## 2016-08-18 NOTE — Telephone Encounter (Signed)
Pt request amoxil to be called to [facility] instead of [facility] [place].  Called and cancelled script at [facility]
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
## 2016-08-18 NOTE — Addendum Note (Signed)
Addended by: Arley Phenix L on: 08/18/2016 04:17 PM   Modules accepted: Orders

## 2016-08-28 ENCOUNTER — Other Ambulatory Visit: Payer: Self-pay | Admitting: Medical

## 2016-08-28 ENCOUNTER — Telehealth: Payer: Self-pay | Admitting: Medical

## 2016-08-28 MED ORDER — LEVOFLOXACIN 500 MG PO TABS: 500.0000 mg | ORAL_TABLET | Freq: Every day | ORAL | 0 refills | Status: DC

## 2016-08-28 NOTE — Telephone Encounter (Signed)
Called and l/m on his voicemail and new meds and call us back if this doesn't work

## 2016-08-28 NOTE — Telephone Encounter (Signed)
Pt called and is requesting another round of antibiotics he finished his last pill this morning but is still feeling pressure and pain, he feels better but not 100 %, pt states he has never had a sinus infection like this before. Pt uses [place] and pt can be reached at [phone] with any questions

## 2016-08-28 NOTE — Telephone Encounter (Signed)
Have him try a week of Levaquin, different antibiotic.  If not resolved by the end of this, then recheck.

## 2017-08-27 ENCOUNTER — Ambulatory Visit: Payer: Commercial Managed Care - HMO | Admitting: Medical

## 2017-08-27 ENCOUNTER — Encounter: Payer: Self-pay | Admitting: Medical

## 2017-08-27 VITALS — BP 110/72 | HR 66 | Temp 98.1°F | Wt 185.6 lb

## 2017-08-27 DIAGNOSIS — H938X2 Other specified disorders of left ear: Secondary | ICD-10-CM

## 2017-08-27 DIAGNOSIS — J0100 Acute maxillary sinusitis, unspecified: Secondary | ICD-10-CM

## 2017-08-27 MED ORDER — AMOXICILLIN 500 MG PO TABS: ORAL_TABLET | ORAL | 0 refills | Status: DC

## 2017-08-27 NOTE — Progress Notes (Signed)
Subjective:  Oscar Ellis is a 46 y.o. male who presents for possible left ear pressure and left maxillary sinus pressure for 2+ weeks.   Has some congestion.  No cough, no fever, no sore throat, no NVD, no other c/o.   Nonsmoker   No sick contacts.    Has mole he is getting taken off left face by Dr. Allyson Sabal in January.  No other aggravating or relieving factors.  No other c/o.  Past Medical History:  Diagnosis Date  . Allergy   . Asthma    mild, worse as teenager  . Eczema    Dr. Allyson Sabal  . H/O echocardiogram 2012   had chest pain/stress at that time, EKG abnormal; eval with echo and holter normal, cardiology, Dr. Gwenlyn Found [facility]  . Spermatocele of epididymis, single    right    ROS as in subjective   Objective: BP 110/72   Pulse 66   Temp 98.1 F (36.7 C)   Wt 185 lb 9.6 oz (84.2 kg)   SpO2 98%   BMI 25.17 kg/m   General appearance: Alert, WD/WN, no distress                             Skin: warm, no rash                           Head: + left maxillary sinus tenderness,                            Eyes: conjunctiva normal, corneas clear, PERRLA                            Ears: flat TMs, external ear canals normal                          Nose: septum midline, turbinates swollen, with erythema and clear discharge             Mouth/throat: MMM, tongue normal, mild pharyngeal erythema                           Neck: supple, no adenopathy, no thyromegaly, non tender                        Lungs: CTA bilaterally, no wheezes, rales, or rhonchi       Assessment  Encounter Diagnoses  Name Primary?  . Acute non-recurrent maxillary sinusitis Yes  . Ear pressure, left       Plan: Discussed symptoms, findings, treatment recommendations.  Specific home care recommendations today include:  Only take over-the-counter (OTC) or prescription medicines for pain, discomfort, or fever as directed by your caregiver.    Decongestant: You may use OTC Guaifenesin (Mucinex plain)  for congestion.  You may use Pseudoephedrine (Sudafed) only if you don't have blood pressure problems or a diagnosis of hypertension.  Cough suppression: If you have cough from drainage, you may use over-the-counter Dextromethorphan (Delsym) as directed on the label  Pain/fever relief: You may use over-the-counter Tylenol for pain or fever  Drink extra fluids. Fluids help thin the mucus so your sinuses can drain more easily.   Applying either moist heat or ice packs to the sinus areas may help  relieve discomfort.  Use saline nasal sprays to help moisten your sinuses. The sprays can be found at your local drugstore.   Oscar Ellis was seen today for sinus problem.  Diagnoses and all orders for this visit:  Acute non-recurrent maxillary sinusitis  Ear pressure, left  Other orders -     amoxicillin (AMOXIL) 500 MG tablet; 2 tablets po BID x 10 days   Patient was advised to call or return if worse or not improving in the next few days.    Patient voiced understanding of diagnosis, recommendations, and treatment plan.

## 2017-09-08 DIAGNOSIS — J329 Chronic sinusitis, unspecified: Secondary | ICD-10-CM | POA: Diagnosis not present

## 2017-09-08 DIAGNOSIS — J324 Chronic pansinusitis: Secondary | ICD-10-CM | POA: Diagnosis not present

## 2017-09-08 DIAGNOSIS — G4489 Other headache syndrome: Secondary | ICD-10-CM | POA: Diagnosis not present

## 2017-11-02 DIAGNOSIS — D225 Melanocytic nevi of trunk: Secondary | ICD-10-CM | POA: Diagnosis not present

## 2017-11-02 DIAGNOSIS — D1801 Hemangioma of skin and subcutaneous tissue: Secondary | ICD-10-CM | POA: Diagnosis not present

## 2017-11-02 DIAGNOSIS — L821 Other seborrheic keratosis: Secondary | ICD-10-CM | POA: Diagnosis not present

## 2018-08-21 DIAGNOSIS — M79646 Pain in unspecified finger(s): Secondary | ICD-10-CM | POA: Diagnosis not present

## 2018-08-21 DIAGNOSIS — M25541 Pain in joints of right hand: Secondary | ICD-10-CM | POA: Diagnosis not present

## 2018-08-21 DIAGNOSIS — S62306A Unspecified fracture of fifth metacarpal bone, right hand, initial encounter for closed fracture: Secondary | ICD-10-CM | POA: Diagnosis not present

## 2018-08-23 ENCOUNTER — Encounter (INDEPENDENT_AMBULATORY_CARE_PROVIDER_SITE_OTHER): Payer: Self-pay | Admitting: Orthopaedic Surgery

## 2018-08-23 ENCOUNTER — Ambulatory Visit (INDEPENDENT_AMBULATORY_CARE_PROVIDER_SITE_OTHER): Payer: 59 | Admitting: Orthopaedic Surgery

## 2018-08-23 DIAGNOSIS — S62329A Displaced fracture of shaft of unspecified metacarpal bone, initial encounter for closed fracture: Secondary | ICD-10-CM | POA: Insufficient documentation

## 2018-08-23 DIAGNOSIS — S62326A Displaced fracture of shaft of fifth metacarpal bone, right hand, initial encounter for closed fracture: Secondary | ICD-10-CM

## 2018-08-23 NOTE — Progress Notes (Signed)
Office Visit Note   Patient: Oscar Ellis           Date of Birth: 1971-08-18           MRN: [id number] Visit Date: 08/23/2018              Requested by: Carlena Hurl, PA-C [id number] [place], [place] [address] PCP: Carlena Hurl, PA-C   Assessment & Plan: Visit Diagnoses:  1. Fracture of metacarpal shaft of right hand, closed, initial encounter     Plan: We are recommending surgery on this right fifth metacarpal fracture based on the shortening and the motion of the fracture site itself.  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  All question concerns were answered and addressed.  I described in detail what the surgery involves as well as the discussion the risk minutes of surgery and his postoperative care.  I will see him back in 2 weeks postoperative with a repeat 3 views of his right hand.  Follow-Up Instructions: Return for 2 weeks post-op.   Orders:  No orders of the defined types were placed in this encounter.  No orders of the defined types were placed in this encounter.     Procedures: No procedures performed   Clinical Data: No additional findings.   Subjective: Chief Complaint  Patient presents with  . 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  He was seen at urgent care center and x-rays were obtained and showed a displaced and shortened metacarpal shaft fracture of the right fifth metacarpal.  He is having significant pain from this fracture itself.  This is affecting his activities daily living as well as his mobility and his job.  He denies any numbness and tingling or any other injuries.  He is never fractured this before.  HPI  Review of Systems Currently denies any headache, chest pain, shortness of breath,  fever, chills, nausea, vomiting.  Objective: Vital Signs: There were no vitals taken for this visit.  Physical Exam He is alert and oriented x3 and in no acute distress Ortho Exam Examination of his right hand does show swelling dorsally.  I can feel the deformity of the fracture itself.  There is no rotational deficits of his fingers and his hand is well-perfused with normal sensation. Specialty Comments:  No specialty comments available.  Imaging: No results found. Independent review of x-rays of his right hand show a displaced and shortened metacarpal shaft fracture of the fifth metacarpal.  PMFS History: Patient Active Problem List   Diagnosis Date Noted  . Fracture of metacarpal shaft of right hand, closed, initial encounter 08/23/2018  . Chest tightness 07/28/2011  . General medical examination 07/28/2011   Past Medical History:  Diagnosis Date  . Allergy   . Asthma    mild, worse as teenager  . Eczema    Dr. Allyson Sabal  . H/O echocardiogram 2012   had chest pain/stress at that time, EKG abnormal; eval with echo and holter normal, cardiology, Dr. Gwenlyn Found [facility]  . Spermatocele of epididymis, single    right    Family History  Problem Relation Age of Onset  . Inflammatory bowel disease Sister   . COPD Maternal Grandfather        black lung  .  Heart disease Neg Hx   . Stroke Neg Hx   . Diabetes Neg Hx   . Hypertension Neg Hx   . Hyperlipidemia Neg Hx   . Cancer Neg Hx   . Dementia Maternal Grandmother     Past Surgical History:  Procedure Laterality Date  . APPENDECTOMY  1989  . LACERATION REPAIR  2001   left forehead  . SKIN BIOPSY     Dr. Allyson Sabal   Social History   Occupational History  . Occupation: Chemical engineer: UNEMPLOYED  Tobacco Use  . Smoking status: Never Smoker  . Smokeless tobacco: Never Used  Substance and Sexual Activity  . Alcohol use: Yes    Alcohol/week: 1.0 standard drinks    Types: 1 Standard drinks or equivalent per week    . Drug use: No  . Sexual activity: Not on file

## 2018-08-25 ENCOUNTER — Encounter: Payer: Self-pay | Admitting: Orthopaedic Surgery

## 2018-08-25 DIAGNOSIS — S62326A Displaced fracture of shaft of fifth metacarpal bone, right hand, initial encounter for closed fracture: Secondary | ICD-10-CM | POA: Diagnosis not present

## 2018-08-25 DIAGNOSIS — S62327A Displaced fracture of shaft of fifth metacarpal bone, left hand, initial encounter for closed fracture: Secondary | ICD-10-CM | POA: Diagnosis not present

## 2018-08-25 DIAGNOSIS — S62316A Displaced fracture of base of fifth metacarpal bone, right hand, initial encounter for closed fracture: Secondary | ICD-10-CM | POA: Diagnosis not present

## 2018-08-30 ENCOUNTER — Encounter (INDEPENDENT_AMBULATORY_CARE_PROVIDER_SITE_OTHER): Payer: Self-pay

## 2018-08-30 ENCOUNTER — Telehealth (INDEPENDENT_AMBULATORY_CARE_PROVIDER_SITE_OTHER): Payer: Self-pay

## 2018-08-30 ENCOUNTER — Telehealth (INDEPENDENT_AMBULATORY_CARE_PROVIDER_SITE_OTHER): Payer: Self-pay | Admitting: Orthopaedic Surgery

## 2018-08-30 NOTE — Telephone Encounter (Signed)
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

## 2018-08-30 NOTE — Telephone Encounter (Signed)
Patient called left voicemail message needing to return to work note faxed to his employer with any and all restrictions. The fax# is [employer]   Attn: Yevonne Pax or Dr. Geoffry Paradise   The number to contact patient is [phone]

## 2018-08-30 NOTE — Telephone Encounter (Signed)
Patient came by the office and I printed him a note off

## 2018-09-07 ENCOUNTER — Ambulatory Visit (INDEPENDENT_AMBULATORY_CARE_PROVIDER_SITE_OTHER): Payer: 59

## 2018-09-07 ENCOUNTER — Ambulatory Visit (INDEPENDENT_AMBULATORY_CARE_PROVIDER_SITE_OTHER): Payer: 59 | Admitting: Orthopaedic Surgery

## 2018-09-07 ENCOUNTER — Encounter (INDEPENDENT_AMBULATORY_CARE_PROVIDER_SITE_OTHER): Payer: Self-pay | Admitting: Orthopaedic Surgery

## 2018-09-07 DIAGNOSIS — S62326D Displaced fracture of shaft of fifth metacarpal bone, right hand, subsequent encounter for fracture with routine healing: Secondary | ICD-10-CM

## 2018-09-07 DIAGNOSIS — S62306A Unspecified fracture of fifth metacarpal bone, right hand, initial encounter for closed fracture: Secondary | ICD-10-CM | POA: Diagnosis not present

## 2018-09-07 DIAGNOSIS — S62329A Displaced fracture of shaft of unspecified metacarpal bone, initial encounter for closed fracture: Secondary | ICD-10-CM

## 2018-09-07 NOTE — Progress Notes (Signed)
The patient is 2 weeks tomorrow status post open reduction-internal fixation of a comminuted fifth metacarpal shaft fracture of his dominant right hand.  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  He has been very uncomfortable since surgery.  On exam we took the sutures out in place Steri-Strips.  Is very stiff at the MCP joint of the fifth finger.  There is some subjective numbness around the area.  His hand is well-perfused.  3 views of the hand are obtained today x-ray wise and show intact hardware and the fracture line anatomically.  At this point he will work on increasing range of motion of his hand.  I would like an appointment to hand therapy just yet because I want the fracture to consolidate more.  He can work on a Optometrist and work on Technical brewer I showed him some things to try.  We will see him back in 4 weeks with a repeat 3 views of his right hand.  All question concerns were answered and addressed.

## 2018-10-03 ENCOUNTER — Ambulatory Visit (INDEPENDENT_AMBULATORY_CARE_PROVIDER_SITE_OTHER): Payer: 59

## 2018-10-03 ENCOUNTER — Encounter (INDEPENDENT_AMBULATORY_CARE_PROVIDER_SITE_OTHER): Payer: Self-pay | Admitting: Orthopaedic Surgery

## 2018-10-03 ENCOUNTER — Ambulatory Visit (INDEPENDENT_AMBULATORY_CARE_PROVIDER_SITE_OTHER): Payer: 59 | Admitting: Orthopaedic Surgery

## 2018-10-03 DIAGNOSIS — S62326D Displaced fracture of shaft of fifth metacarpal bone, right hand, subsequent encounter for fracture with routine healing: Secondary | ICD-10-CM

## 2018-10-03 DIAGNOSIS — S62329A Displaced fracture of shaft of unspecified metacarpal bone, initial encounter for closed fracture: Secondary | ICD-10-CM

## 2018-10-03 NOTE — Progress Notes (Signed)
The patient is now about 5 weeks status post open reduction internal fixation of a right dominant hand fifth metacarpal shaft fracture.  He is doing well overall.  He has some pain in fifth MCP joint that he points to but otherwise is been using a squeeze ball and getting his motion back.  On exam he has full flexion extension of all the digits of his right hand.  His incisions healing nicely.  There is still some residual swelling.  There is no rotational deficits.  3 views of the right hand show interval healing of his fracture.  Is not healed yet.  There is no evidence of hardware failure of the plate and screws from the fifth metacarpal of the right hand.  We will continue increase his activities but will hold off from push-ups curls and bench pressing.  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  I will see him back for potential final visit in 3 weeks with a repeat 3 views of his right hand.  All question concerns were answered and addressed.

## 2018-10-31 ENCOUNTER — Encounter (INDEPENDENT_AMBULATORY_CARE_PROVIDER_SITE_OTHER): Payer: Self-pay | Admitting: Orthopaedic Surgery

## 2018-10-31 ENCOUNTER — Ambulatory Visit (INDEPENDENT_AMBULATORY_CARE_PROVIDER_SITE_OTHER): Payer: 59

## 2018-10-31 ENCOUNTER — Ambulatory Visit (INDEPENDENT_AMBULATORY_CARE_PROVIDER_SITE_OTHER): Payer: 59 | Admitting: Orthopaedic Surgery

## 2018-10-31 DIAGNOSIS — S62329A Displaced fracture of shaft of unspecified metacarpal bone, initial encounter for closed fracture: Secondary | ICD-10-CM

## 2018-10-31 DIAGNOSIS — S62326A Displaced fracture of shaft of fifth metacarpal bone, right hand, initial encounter for closed fracture: Secondary | ICD-10-CM

## 2018-10-31 NOTE — Progress Notes (Signed)
The patient is now just over a week status post open reduction-internal fixation of a displaced right hand fifth metacarpal shaft fracture.  He is back to all his regular activities except for heavy working out.  He has just a little bit of achy feeling of the hand.  On exam he is got excellent grip strength.  His range of motion is full.  His incisions well-healed.  He has no instability when stressing the fracture site and no pain.  3 views of his right hand show the fracture is healed completely and there is no evidence of hardware failure.  He can resume all physical activities as comfort allows.  All question concerns were answered and addressed.  If he has any problems with symptomatic hardware he will let us know because we can always remove the plate and screws.

## 2021-07-31 ENCOUNTER — Other Ambulatory Visit: Payer: Self-pay

## 2021-07-31 ENCOUNTER — Encounter: Payer: Self-pay | Admitting: Family

## 2021-07-31 ENCOUNTER — Ambulatory Visit: Payer: 59 | Admitting: Family

## 2021-07-31 VITALS — BP 120/76 | HR 58 | Temp 97.6°F | Ht 72.0 in | Wt 183.6 lb

## 2021-07-31 DIAGNOSIS — Z0000 Encounter for general adult medical examination without abnormal findings: Secondary | ICD-10-CM | POA: Insufficient documentation

## 2021-07-31 DIAGNOSIS — Z7689 Persons encountering health services in other specified circumstances: Secondary | ICD-10-CM

## 2021-07-31 HISTORY — DX: Encounter for general adult medical examination without abnormal findings: Z00.00

## 2021-07-31 LAB — CBC WITH DIFFERENTIAL/PLATELET
Basophils Absolute: 0.1 10*3/uL (ref 0.0–0.1)
Basophils Relative: 0.8 % (ref 0.0–3.0)
Eosinophils Absolute: 0.2 10*3/uL (ref 0.0–0.7)
Eosinophils Relative: 2.1 % (ref 0.0–5.0)
HCT: 44.6 % (ref 39.0–52.0)
Hemoglobin: 15.4 g/dL (ref 13.0–17.0)
Lymphocytes Relative: 29.3 % (ref 12.0–46.0)
Lymphs Abs: 2.1 10*3/uL (ref 0.7–4.0)
MCHC: 34.5 g/dL (ref 30.0–36.0)
MCV: 92 fl (ref 78.0–100.0)
Monocytes Absolute: 0.6 10*3/uL (ref 0.1–1.0)
Monocytes Relative: 8.7 % (ref 3.0–12.0)
Neutro Abs: 4.3 10*3/uL (ref 1.4–7.7)
Neutrophils Relative %: 59.1 % (ref 43.0–77.0)
Platelets: 235 10*3/uL (ref 150.0–400.0)
RBC: 4.85 Mil/uL (ref 4.22–5.81)
RDW: 13 % (ref 11.5–15.5)
WBC: 7.3 10*3/uL (ref 4.0–10.5)

## 2021-07-31 LAB — COMPREHENSIVE METABOLIC PANEL
ALT: 26 U/L (ref 0–53)
AST: 23 U/L (ref 0–37)
Albumin: 4.8 g/dL (ref 3.5–5.2)
Alkaline Phosphatase: 86 U/L (ref 39–117)
BUN: 15 mg/dL (ref 6–23)
CO2: 31 mEq/L (ref 19–32)
Calcium: 9.9 mg/dL (ref 8.4–10.5)
Chloride: 98 mEq/L (ref 96–112)
Creatinine, Ser: 1.49 mg/dL (ref 0.40–1.50)
GFR: 55.22 mL/min — ABNORMAL LOW (ref >60.00)
Glucose, Bld: 92 mg/dL (ref 70–99)
Potassium: 4.3 mEq/L (ref 3.5–5.1)
Sodium: 137 mEq/L (ref 135–145)
Total Bilirubin: 1.8 mg/dL — ABNORMAL HIGH (ref 0.2–1.2)
Total Protein: 7.5 g/dL (ref 6.0–8.3)

## 2021-07-31 LAB — LIPID PANEL
Cholesterol: 238 mg/dL — ABNORMAL HIGH (ref 0–200)
HDL: 47.6 mg/dL (ref >39.00)
LDL Cholesterol: 151 mg/dL — ABNORMAL HIGH (ref 0–99)
NonHDL: 190.1
Total CHOL/HDL Ratio: 5
Triglycerides: 194 mg/dL — ABNORMAL HIGH (ref 0.0–149.0)
VLDL: 38.8 mg/dL (ref 0.0–40.0)

## 2021-07-31 LAB — TSH: TSH: 2.47 u[IU]/mL (ref 0.35–5.50)

## 2021-07-31 NOTE — Assessment & Plan Note (Signed)
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 No concerns, reports recent hand fx requiring surgery. Reports multiple broken bones in past due to different physical sports activities. Discussed need for colonoscopy screening, pt to consider. VSS.

## 2021-07-31 NOTE — Patient Instructions (Signed)
Welcome to [facility]! It was a pleasure meeting you today.  PLEASE NOTE:  If you had any LAB tests please let us know if you have not heard back within a few days. You may see your results on MyChart before we have a chance to review them but we will give you a call once they are reviewed by [place]. If we ordered any REFERRALS today, please let us know if you have not heard from their office within the next week.  Let us know through MyChart if you are needing refills, or have your pharmacy send [place] the request. You can also use MyChart to communicate with me or any office staff.  Please try these tips to maintain a healthy lifestyle:  Eat most of your calories during the day when you are active. Eliminate processed foods including packaged sweets (pies, cakes, cookies), reduce intake of potatoes, white bread, white pasta, and white rice. Look for whole grain options, oat flour or almond flour.  Each meal should contain half fruits/vegetables, one quarter protein, and one quarter carbs (no bigger than a computer mouse).  Cut down on sweet beverages. This includes juice, soda, and sweet tea. Also watch fruit intake, though this is a healthier sweet option, it still contains natural sugar! Limit to 3 servings daily.  Drink at least 1 glass of water with each meal and aim for at least 8 glasses per day  Exercise at least 150 minutes every week.   Take care,  Colletta [place]

## 2021-07-31 NOTE — Progress Notes (Signed)
Chief Complaint:  Oscar Ellis is a 50 y.o. male who presents today for his annual comprehensive physical exam.    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 No concerns, reports recent hand fx requiring surgery. Reports multiple broken bones in past due to different physical sports activities. Discussed need for colonoscopy screening, pt to consider. VSS.   Body mass index is 24.9 kg/m. / WNL  BMI Metric Follow Up - 07/31/21 1302       BMI Metric Follow Up-Please document annually   BMI Metric Follow Up Education provided              Preventative Healthcare: Discussed need for Colonoscopy, pt will consider and let me know.   Patient Counseling(The following topics were reviewed and/or handout was given):  -Nutrition: Stressed importance of moderation in sodium/caffeine intake, saturated fat and cholesterol, caloric balance, sufficient intake of fresh fruits, vegetables, and fiber.  -Stressed the importance of regular exercise.   -Substance Abuse: Discussed importance of avoiding tobacco, or other drug use & limited alcohol; driving or other dangerous activities under the influence; availability of treatment for abuse.   -Injury prevention: Discussed safety belts, safety helmets, smoke detector, smoking near bedding or upholstery.   -Sexuality: Discussed sexually transmitted diseases, partner selection, use of condoms, avoidance of unintended pregnancy and contraceptive alternatives.   -Dental health: Discussed importance of regular tooth brushing, flossing, and dental visits.  -Health maintenance and immunizations reviewed. Please refer to Health maintenance section.  Return to care in 1 year for next preventative visit.     Subjective:  HPI:  He has no acute complaints today.   Lifestyle Diet: trying to eat healthy Exercise: reports  about 1 hour per day.  Review of Systems  Constitutional: Negative.   HENT: Negative.    Eyes: Negative.   Respiratory: Negative.    Cardiovascular: Negative.   Gastrointestinal: Negative.   Genitourinary: Negative.   Musculoskeletal: Negative.   Skin: Negative.   Neurological: Negative.   Endo/Heme/Allergies: Negative.   Psychiatric/Behavioral: Negative.      Depression screen PHQ 2/9 07/31/2021  Decreased Interest 0  Down, Depressed, Hopeless 0  PHQ - 2 Score 0    Health Maintenance Due  Topic Date Due   HIV Screening  Never done   Hepatitis C Screening  Never done   COLONOSCOPY (Pts 45-43yrs Insurance coverage will need to be confirmed)  Never done         Objective:  Physical Exam: BP 120/76   Pulse (!) 58   Temp 97.6 F (36.4 C) (Temporal)   Ht 6' (1.829 m)   Wt 183 lb 9.6 oz (83.3 kg)   SpO2 98%   BMI 24.90 kg/m   Body mass index is 24.9 kg/m. Wt Readings from Last 3 Encounters:  07/31/21 183 lb 9.6 oz (83.3 kg)  08/27/17 185 lb 9.6 oz (84.2 kg)  08/18/16 177 lb (80.3 kg)   Gen: NAD, resting comfortably HEENT: TMs normal bilaterally. OP clear. No thyromegaly noted.  CV: RRR with no murmurs appreciated Pulm: NWOB, CTAB with no crackles, wheezes, or rhonchi GI: Normal bowel sounds present. Soft, Nontender, Nondistended. MSK: no edema, cyanosis, or clubbing noted Skin: warm, dry Neuro: CN2-12 grossly intact. Strength 5/5 in upper and lower extremities. Reflexes symmetric and intact bilaterally.  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 No concerns, reports recent hand fx requiring surgery. Reports multiple broken bones in past due to different physical sports activities. Discussed need for colonoscopy screening, pt to consider. VSS.      Relevant Orders    Comprehensive metabolic panel (Completed)   TSH (Completed)   Lipid panel (Completed)   CBC with Differential/Platelet (Completed)

## 2021-08-04 DIAGNOSIS — Z7689 Persons encountering health services in other specified circumstances: Secondary | ICD-10-CM | POA: Insufficient documentation

## 2021-08-04 HISTORY — DX: Persons encountering health services in other specified circumstances: Z76.89

## 2021-08-07 ENCOUNTER — Encounter: Payer: Self-pay | Admitting: Family

## 2021-10-20 ENCOUNTER — Other Ambulatory Visit: Payer: Self-pay

## 2021-10-20 DIAGNOSIS — Z1211 Encounter for screening for malignant neoplasm of colon: Secondary | ICD-10-CM

## 2021-11-17 ENCOUNTER — Ambulatory Visit: Payer: 59 | Admitting: Physician Assistant

## 2021-11-17 ENCOUNTER — Other Ambulatory Visit: Payer: Self-pay

## 2021-11-17 ENCOUNTER — Encounter: Payer: Self-pay | Admitting: Physician Assistant

## 2021-11-17 VITALS — BP 118/74 | HR 55 | Temp 97.2°F | Ht 72.0 in | Wt 187.2 lb

## 2021-11-17 DIAGNOSIS — H6982 Other specified disorders of Eustachian tube, left ear: Secondary | ICD-10-CM

## 2021-11-17 MED ORDER — METHYLPREDNISOLONE 4 MG PO TBPK: ORAL_TABLET | ORAL | 0 refills | Status: DC

## 2021-11-17 NOTE — Progress Notes (Signed)
Subjective:    Patient ID: Oscar Ellis, male    DOB: 1971-02-04, 51 y.o.   MRN: [id number]  Chief Complaint  Patient presents with   Ear Fullness    Still with ear infection symptoms Took antibiotic with no changes     HPI Patient is in today for left ear pain and fullness x 4 weeks. Pt had a virtual visit on 11/03/21 and was told to use Sudafed and Flonase, which he says didn't help much. Took the course of amoxicillin prescribed, finished this on 11/15/21, still feeling fullness. No HA or dizziness. No ringing. No hearing loss.   Past Medical History:  Diagnosis Date   Allergy    Asthma    mild, worse as teenager   Eczema    Dr. Allyson Sabal   H/O echocardiogram 2012   had chest pain/stress at that time, EKG abnormal; eval with echo and holter normal, cardiology, Dr. Gwenlyn Found [facility]   Spermatocele of epididymis, single    right    Past Surgical History:  Procedure Laterality Date   APPENDECTOMY  10/13/1987   LACERATION REPAIR  10/13/1999   left forehead   OSTEOTOMY METACARPAL / FINGER Right    pinky   SKIN BIOPSY     Dr. Allyson Sabal    Family History  Problem Relation Age of Onset   Inflammatory bowel disease Sister    COPD Maternal Grandfather        black lung   Heart disease Neg Hx    Stroke Neg Hx    Diabetes Neg Hx    Hypertension Neg Hx    Hyperlipidemia Neg Hx    Cancer Neg Hx    Dementia Maternal Grandmother     Social History   Tobacco Use   Smoking status: Never   Smokeless tobacco: Never  Substance Use Topics   Alcohol use: Yes    Alcohol/week: 1.0 standard drink    Types: 1 Standard drinks or equivalent per week   Drug use: No     Allergies  Allergen Reactions   Neosporin [Neomycin-Bacitracin Zn-Polymyx]     rash    Review of Systems NEGATIVE UNLESS OTHERWISE INDICATED IN HPI      Objective:     BP 118/74    Pulse (!) 55    Temp (!) 97.2 F (36.2 C) (Temporal)    Ht 6' (1.829 m)    Wt 187 lb 3.2 oz (84.9 kg)    SpO2 98%    BMI 25.39  kg/m   Wt Readings from Last 3 Encounters:  11/17/21 187 lb 3.2 oz (84.9 kg)  07/31/21 183 lb 9.6 oz (83.3 kg)  08/27/17 185 lb 9.6 oz (84.2 kg)    BP Readings from Last 3 Encounters:  11/17/21 118/74  07/31/21 120/76  08/27/17 110/72     Physical Exam Constitutional:      General: He is not in acute distress.    Appearance: Normal appearance. He is normal weight.  HENT:     Right Ear: Tympanic membrane, ear canal and external ear normal. There is no impacted cerumen.     Left Ear: Hearing, ear canal and external ear normal. A middle ear effusion (cloudy TM) is present. There is no impacted cerumen. Tympanic membrane is not injected or erythematous.  Neurological:     Mental Status: He is alert.       Assessment & Plan:   Problem List Items Addressed This Visit   None Visit Diagnoses  Acute dysfunction of left eustachian tube    -  Primary        Meds ordered this encounter  Medications   methylPREDNISolone (MEDROL DOSEPAK) 4 MG TBPK tablet    Sig: Please take per packaging instructions.    Dispense:  21 tablet    Refill:  0    1. Acute dysfunction of left eustachian tube Just completed amoxicillin course, reassured no need to repeat antibiotics. Take the medrol dose pak as directed. Drink plenty of water. Take Claritin or Zyrtec daily. Use nasal saline. If worse or no better, will need to see ENT.    Grey Schlauch M Wateen Varon, PA-C

## 2021-11-17 NOTE — Patient Instructions (Signed)
Take the medrol dose pak as directed. Drink plenty of water. Take Claritin or Zyrtec daily. Use nasal saline. If worse or no better, will need to see ENT.

## 2021-11-19 ENCOUNTER — Ambulatory Visit: Payer: 59 | Admitting: Family

## 2021-11-21 ENCOUNTER — Encounter: Payer: Self-pay | Admitting: Physician Assistant

## 2021-11-25 ENCOUNTER — Other Ambulatory Visit: Payer: Self-pay | Admitting: *Deleted

## 2021-11-25 DIAGNOSIS — H6982 Other specified disorders of Eustachian tube, left ear: Secondary | ICD-10-CM

## 2021-11-25 NOTE — Telephone Encounter (Signed)
Spoke with patient stated finish steroid. Still with Left sinuses pressure taking otc sudafed with very some help but symptoms worsen at night  Patent want to know if he can be referred to ENT or need another OV? Please advise

## 2021-12-01 ENCOUNTER — Encounter: Payer: Self-pay | Admitting: Family

## 2021-12-08 ENCOUNTER — Encounter: Payer: Self-pay | Admitting: Gastroenterology

## 2022-01-07 ENCOUNTER — Other Ambulatory Visit: Payer: Self-pay

## 2022-01-07 ENCOUNTER — Ambulatory Visit (AMBULATORY_SURGERY_CENTER): Payer: 59 | Admitting: *Deleted

## 2022-01-07 VITALS — Ht 70.0 in | Wt 180.0 lb

## 2022-01-07 DIAGNOSIS — Z1211 Encounter for screening for malignant neoplasm of colon: Secondary | ICD-10-CM

## 2022-01-07 MED ORDER — NA SULFATE-K SULFATE-MG SULF 17.5-3.13-1.6 GM/177ML PO SOLN: 2.0000 | Freq: Once | ORAL | 0 refills | Status: AC

## 2022-01-07 NOTE — Progress Notes (Signed)

## 2022-01-13 ENCOUNTER — Encounter: Payer: Self-pay | Admitting: Gastroenterology

## 2022-01-21 ENCOUNTER — Encounter: Payer: Self-pay | Admitting: Gastroenterology

## 2022-01-21 ENCOUNTER — Ambulatory Visit (AMBULATORY_SURGERY_CENTER): Payer: 59 | Admitting: Gastroenterology

## 2022-01-21 VITALS — BP 97/60 | HR 56 | Temp 97.8°F | Resp 15 | Ht 72.0 in | Wt 180.0 lb

## 2022-01-21 DIAGNOSIS — Z1211 Encounter for screening for malignant neoplasm of colon: Secondary | ICD-10-CM

## 2022-01-21 MED ORDER — SODIUM CHLORIDE 0.9 % IV SOLN: 500.0000 mL | Freq: Once | INTRAVENOUS | Status: DC

## 2022-01-21 NOTE — Progress Notes (Signed)
Sedate, gd SR, tolerated procedure well, VSS, report to RN 

## 2022-01-21 NOTE — Progress Notes (Signed)
Pt's states no medical or surgical changes since previsit or office visit. 

## 2022-01-21 NOTE — Patient Instructions (Signed)
YOU HAD AN ENDOSCOPIC PROCEDURE TODAY AT THE [facility]:   Refer to the procedure report that was given to you for any specific questions about what was found during the examination.  If the procedure report does not answer your questions, please call your gastroenterologist to clarify.  If you requested that your care partner not be given the details of your procedure findings, then the procedure report has been included in a sealed envelope for you to review at your convenience later.  YOU SHOULD EXPECT: Some feelings of bloating in the abdomen. Passage of more gas than usual.  Walking can help get rid of the air that was put into your GI tract during the procedure and reduce the bloating. If you had a lower endoscopy (such as a colonoscopy or flexible sigmoidoscopy) you may notice spotting of blood in your stool or on the toilet paper. If you underwent a bowel prep for your procedure, you may not have a normal bowel movement for a few days.  Please Note:  You might notice some irritation and congestion in your nose or some drainage.  This is from the oxygen used during your procedure.  There is no need for concern and it should clear up in a day or so.  SYMPTOMS TO REPORT IMMEDIATELY:   Following lower endoscopy (colonoscopy or flexible sigmoidoscopy):  Excessive amounts of blood in the stool  Significant tenderness or worsening of abdominal pains  Swelling of the abdomen that is new, acute  Fever of 100F or higher  For urgent or emergent issues, a gastroenterologist can be reached at any hour by calling [phone]. Do not use MyChart messaging for urgent concerns.    DIET:  We do recommend a small meal at first, but then you may proceed to your regular diet.  Drink plenty of fluids but you should avoid alcoholic beverages for 24 hours.  ACTIVITY:  You should plan to take it easy for the rest of today and you should NOT DRIVE or use heavy machinery until tomorrow (because  of the sedation medicines used during the test).    FOLLOW UP: Our staff will call the number listed on your records 48-72 hours following your procedure to check on you and address any questions or concerns that you may have regarding the information given to you following your procedure. If we do not reach you, we will leave a message.  We will attempt to reach you two times.  During this call, we will ask if you have developed any symptoms of COVID 19. If you develop any symptoms (ie: fever, flu-like symptoms, shortness of breath, cough etc.) before then, please call [phone].  If you test positive for Covid 19 in the 2 weeks post procedure, please call and report this information to us.    If any biopsies were taken you will be contacted by phone or by letter within the next 1-3 weeks.  Please call us at [phone] if you have not heard about the biopsies in 3 weeks.    SIGNATURES/CONFIDENTIALITY: You and/or your care partner have signed paperwork which will be entered into your electronic medical record.  These signatures attest to the fact that that the information above on your After Visit Summary has been reviewed and is understood.  Full responsibility of the confidentiality of this discharge information lies with you and/or your care-partner. 

## 2022-01-21 NOTE — Op Note (Signed)
[facility] ?Patient Name: Oscar Ellis ?Procedure Date: 01/21/2022 9:14 AM ?MRN: [id number] ?Endoscopist: Estill Cotta. Loletha Carrow , MD ?Age: 51 ?Referring MD:  ?Date of Birth: 19-Jun-1971 ?Gender: Male ?Account #: [id number] ?Procedure:                Colonoscopy ?Indications:              Screening for colorectal malignant neoplasm, This  ?                          is the patient's first colonoscopy ?Medicines:                Monitored Anesthesia Care ?Procedure:                Pre-Anesthesia Assessment: ?                          - Prior to the procedure, a History and Physical  ?                          was performed, and patient medications and  ?                          allergies were reviewed. The patient's tolerance of  ?                          previous anesthesia was also reviewed. The risks  ?                          and benefits of the procedure and the sedation  ?                          options and risks were discussed with the patient.  ?                          All questions were answered, and informed consent  ?                          was obtained. Prior Anticoagulants: The patient has  ?                          taken no previous anticoagulant or antiplatelet  ?                          agents. ASA Grade Assessment: I - A normal, healthy  ?                          patient. After reviewing the risks and benefits,  ?                          the patient was deemed in satisfactory condition to  ?                          undergo the procedure. ?  After obtaining informed consent, the colonoscope  ?                          was passed under direct vision. Throughout the  ?                          procedure, the patient's blood pressure, pulse, and  ?                          oxygen saturations were monitored continuously. The  ?                          CF [id number] #[id number] was introduced through the anus  ?                          and advanced to the the cecum,  identified by  ?                          appendiceal orifice and ileocecal valve. The  ?                          colonoscopy was performed without difficulty. The  ?                          patient tolerated the procedure well. The quality  ?                          of the bowel preparation was good. The appendiceal  ?                          orifice and rectum were photographed. The bowel  ?                          preparation used was SUPREP. ?Scope In: 9:24:08 AM ?Scope Out: 9:38:42 AM ?Scope Withdrawal Time: 0 hours 11 minutes 15 seconds  ?Total Procedure Duration: 0 hours 14 minutes 34 seconds  ?Findings:                 The perianal and digital rectal examinations were  ?                          normal. ?                          Repeat examination of right colon under NBI  ?                          performed. ?                          The entire examined colon appeared normal on direct  ?                          and retroflexion views. ?Complications:            No immediate complications. ?Estimated Blood  Loss:     Estimated blood loss: none. ?Impression:               - The entire examined colon is normal on direct and  ?                          retroflexion views. ?                          - No specimens collected. ?Recommendation:           - Patient has a contact number available for  ?                          emergencies. The signs and symptoms of potential  ?                          delayed complications were discussed with the  ?                          patient. Return to normal activities tomorrow.  ?                          Written discharge instructions were provided to the  ?                          patient. ?                          - Resume previous diet. ?                          - Continue present medications. ?                          - Repeat colonoscopy in 10 years for screening  ?                          purposes. ?Xylia Scherger L. Loletha Carrow, MD ?01/21/2022 9:43:23 AM ?This report  has been signed electronically. ?

## 2022-01-21 NOTE — Progress Notes (Signed)
History and Physical: ? This patient presents for endoscopic testing for: ?Encounter Diagnosis  ?Name Primary?  ? Special screening for malignant neoplasms, colon Yes  ? ? ?First colonoscopy ?Patient denies chronic abdominal pain, rectal bleeding, constipation or diarrhea. ? ? ?ROS: ?Patient denies chest pain or shortness of breath ? ? ?Past Medical History: ?Past Medical History:  ?Diagnosis Date  ? Allergy   ? Asthma   ? mild, worse as teenager  ? Eczema   ? Dr. Allyson Sabal  ? H/O echocardiogram 2012  ? had chest pain/stress at that time, EKG abnormal; eval with echo and holter normal, cardiology, Dr. Gwenlyn Found [facility]  ? Spermatocele of epididymis, single   ? right  ? ? ? ?Past Surgical History: ?Past Surgical History:  ?Procedure Laterality Date  ? APPENDECTOMY  10/13/1987  ? LACERATION REPAIR  10/13/1999  ? left forehead  ? OSTEOTOMY METACARPAL / FINGER Right   ? pinky  ? SKIN BIOPSY    ? Dr. Allyson Sabal  ? ? ?Allergies: ?Allergies  ?Allergen Reactions  ? Neosporin [Neomycin-Bacitracin Zn-Polymyx]   ?  rash  ? ? ?Outpatient Meds: ?Current Outpatient Medications  ?Medication Sig Dispense Refill  ? Loratadine 10 MG CAPS     ? Multiple Vitamins-Minerals (MULTIVITAMIN WITH MINERALS) tablet Take 1 tablet by mouth daily.    ? ?Current Facility-Administered Medications  ?Medication Dose Route Frequency Provider Last Rate Last Admin  ? 0.9 %  sodium chloride infusion  500 mL Intravenous Once Doran Stabler, MD      ? ? ? ? ?___________________________________________________________________ ?Objective  ? ?Exam: ? ?BP 121/67   Pulse 65   Temp 97.8 ?F (36.6 ?C)   Ht 6' (1.829 m)   Wt 180 lb (81.6 kg)   SpO2 97%   BMI 24.41 kg/m?  ? ?CV: RRR without murmur, S1/S2 ?Resp: clear to auscultation bilaterally, normal RR and effort noted ?GI: soft, no tenderness, with active bowel sounds. ? ? ?Assessment: ?Encounter Diagnosis  ?Name Primary?  ? Special screening for malignant neoplasms, colon Yes  ? ? ? ?Plan: ?Colonoscopy ? The  benefits and risks of the planned procedure were described in detail with the patient or (when appropriate) their health care proxy.  Risks were outlined as including, but not limited to, bleeding, infection, perforation, adverse medication reaction leading to cardiac or pulmonary decompensation, pancreatitis (if ERCP).  The limitation of incomplete mucosal visualization was also discussed.  No guarantees or warranties were given. ? ? ? ?The patient is appropriate for an endoscopic procedure in the ambulatory setting. ? ? - Wilfrid Lund, MD ? ? ? ? ?

## 2022-01-23 ENCOUNTER — Telehealth: Payer: Self-pay

## 2022-01-23 NOTE — Telephone Encounter (Signed)
?  Follow up Call- ? ? ?  01/21/2022  ?  8:35 AM  ?Call back number  ?Post procedure Call Back phone  # [phone]  ?Permission to leave phone message Yes  ?  ? ?Patient questions: ? ?Do you have a fever, pain , or abdominal swelling? No. ?Pain Score  0 * ? ?Have you tolerated food without any problems? Yes.   ? ?Have you been able to return to your normal activities? Yes.   ? ?Do you have any questions about your discharge instructions: ?Diet   No. ?Medications  No. ?Follow up visit  No. ? ?Do you have questions or concerns about your Care? No. ? ?Actions: ?* If pain score is 4 or above: ?No action needed, pain <4. ? ? ?

## 2022-03-26 ENCOUNTER — Encounter: Payer: Self-pay | Admitting: Physician Assistant

## 2022-03-26 ENCOUNTER — Ambulatory Visit (HOSPITAL_BASED_OUTPATIENT_CLINIC_OR_DEPARTMENT_OTHER): Payer: 59

## 2022-03-26 ENCOUNTER — Ambulatory Visit: Payer: 59 | Admitting: Physician Assistant

## 2022-03-26 ENCOUNTER — Ambulatory Visit (HOSPITAL_BASED_OUTPATIENT_CLINIC_OR_DEPARTMENT_OTHER)
Admission: RE | Admit: 2022-03-26 | Discharge: 2022-03-26 | Disposition: A | Discharge status: Home / Self Care | Payer: 59 | Source: Ambulatory Visit | Attending: Physician Assistant | Admitting: Physician Assistant

## 2022-03-26 VITALS — BP 125/80 | HR 50 | Temp 98.6°F | Ht 72.0 in | Wt 182.6 lb

## 2022-03-26 DIAGNOSIS — N50812 Left testicular pain: Secondary | ICD-10-CM | POA: Diagnosis present

## 2022-03-26 DIAGNOSIS — G8929 Other chronic pain: Secondary | ICD-10-CM | POA: Diagnosis not present

## 2022-03-26 DIAGNOSIS — R110 Nausea: Secondary | ICD-10-CM

## 2022-03-26 DIAGNOSIS — R1032 Left lower quadrant pain: Secondary | ICD-10-CM

## 2022-03-26 LAB — POCT URINALYSIS DIPSTICK
Bilirubin, UA: NEGATIVE
Blood, UA: NEGATIVE
Glucose, UA: NEGATIVE
Ketones, UA: NEGATIVE
Leukocytes, UA: NEGATIVE
Nitrite, UA: NEGATIVE
Protein, UA: NEGATIVE
Spec Grav, UA: 1.02 (ref 1.010–1.025)
Urobilinogen, UA: 0.2 E.U./dL
pH, UA: 6 (ref 5.0–8.0)

## 2022-03-26 LAB — COMPREHENSIVE METABOLIC PANEL
ALT: 19 U/L (ref 0–53)
AST: 19 U/L (ref 0–37)
Albumin: 4.6 g/dL (ref 3.5–5.2)
Alkaline Phosphatase: 82 U/L (ref 39–117)
BUN: 15 mg/dL (ref 6–23)
CO2: 31 mEq/L (ref 19–32)
Calcium: 9.6 mg/dL (ref 8.4–10.5)
Chloride: 99 mEq/L (ref 96–112)
Creatinine, Ser: 1.06 mg/dL (ref 0.40–1.50)
GFR: 82.71 mL/min (ref >60.00)
Glucose, Bld: 93 mg/dL (ref 70–99)
Potassium: 4.2 mEq/L (ref 3.5–5.1)
Sodium: 137 mEq/L (ref 135–145)
Total Bilirubin: 1.6 mg/dL — ABNORMAL HIGH (ref 0.2–1.2)
Total Protein: 7.4 g/dL (ref 6.0–8.3)

## 2022-03-26 LAB — CBC WITH DIFFERENTIAL/PLATELET
Basophils Absolute: 0 10*3/uL (ref 0.0–0.1)
Basophils Relative: 0.6 % (ref 0.0–3.0)
Eosinophils Absolute: 0.1 10*3/uL (ref 0.0–0.7)
Eosinophils Relative: 1.2 % (ref 0.0–5.0)
HCT: 45 % (ref 39.0–52.0)
Hemoglobin: 15.5 g/dL (ref 13.0–17.0)
Lymphocytes Relative: 28.9 % (ref 12.0–46.0)
Lymphs Abs: 2 10*3/uL (ref 0.7–4.0)
MCHC: 34.5 g/dL (ref 30.0–36.0)
MCV: 92.8 fl (ref 78.0–100.0)
Monocytes Absolute: 0.7 10*3/uL (ref 0.1–1.0)
Monocytes Relative: 9.4 % (ref 3.0–12.0)
Neutro Abs: 4.2 10*3/uL (ref 1.4–7.7)
Neutrophils Relative %: 59.9 % (ref 43.0–77.0)
Platelets: 233 10*3/uL (ref 150.0–400.0)
RBC: 4.85 Mil/uL (ref 4.22–5.81)
RDW: 12.6 % (ref 11.5–15.5)
WBC: 7 10*3/uL (ref 4.0–10.5)

## 2022-03-26 NOTE — Progress Notes (Signed)
Oscar Ellis is a 51 y.o. male here for a new problem of groin pain.   History of Present Illness:   Chief Complaint  Patient presents with   Groin Pain    Pt has been having constant lt groin pain for the past [redacted] weeks along with nausea which is alos constant unless he is full.    HPI  Groin Pain  Patient complain of groin pain that has been onset for 2 weeks or more. States he does have a hx of upper left thigh strain/injury in the same area. However, he thinks this pain feels differently. Has had some nausea as well. States he has been feeling more pain in his left testicular area and some soreness. He has been doing weight lifting 3-4 times a week but denies any injuries or significant strain. He does admit he was running a lot more last month. He does have hx of erectile dysfunction and takes Sildenafil as needed for this symptoms. No urinary symptoms. Denies significant changes to BM or urine. No lumps or bumps. Denies any injury or trauma. Dneies hx of STD.  He is having some nausea. Feels like it could be related to his groin pain. Denies vomiting, significant alcohol/coffee, unintentional weight changes, night sweats.  Past Medical History:  Diagnosis Date   Allergy    Asthma    mild, worse as teenager   Eczema    Dr. Allyson Sabal   H/O echocardiogram 2012   had chest pain/stress at that time, EKG abnormal; eval with echo and holter normal, cardiology, Dr. Gwenlyn [facility]   Spermatocele of epididymis, single    right     Social History   Tobacco Use   Smoking status: Never   Smokeless tobacco: Never  Substance Use Topics   Alcohol use: Yes    Alcohol/week: 1.0 standard drink of alcohol    Types: 1 Standard drinks or equivalent per week   Drug use: No    Past Surgical History:  Procedure Laterality Date   APPENDECTOMY  10/13/1987   LACERATION REPAIR  10/13/1999   left forehead   OSTEOTOMY METACARPAL / FINGER Right    pinky   SKIN BIOPSY     Dr. Allyson Sabal    Family  History  Problem Relation Age of Onset   Inflammatory bowel disease Sister    Dementia Maternal Grandmother    COPD Maternal Grandfather        black lung   Heart disease Neg Hx    Stroke Neg Hx    Diabetes Neg Hx    Hypertension Neg Hx    Hyperlipidemia Neg Hx    Cancer Neg Hx    Colon cancer Neg Hx    Colon polyps Neg Hx    Esophageal cancer Neg Hx    Stomach cancer Neg Hx    Rectal cancer Neg Hx     Allergies  Allergen Reactions   Neosporin [Neomycin-Bacitracin Zn-Polymyx]     rash    Current Medications:   Current Outpatient Medications:    Loratadine 10 MG CAPS, , Disp: , Rfl:    Multiple Vitamins-Minerals (MULTIVITAMIN WITH MINERALS) tablet, Take 1 tablet by mouth daily., Disp: , Rfl:    Review of Systems:   ROS Negative unless otherwise specified per HPI.   Vitals:   Vitals:   03/26/22 1107  BP: 125/80  Pulse: (!) 50  Temp: 98.6 F (37 C)  SpO2: 97%  Weight: 182 lb 9.6 oz (82.8 kg)  Height: 6' (  1.829 m)     Body mass index is 24.77 kg/m.  Physical Exam:   Physical Exam Vitals and nursing note reviewed. Exam conducted with a chaperone present.  Constitutional:      General: He is not in acute distress.    Appearance: He is well-developed. He is not ill-appearing or toxic-appearing.  Cardiovascular:     Rate and Rhythm: Normal rate and regular rhythm.     Pulses: Normal pulses.     Heart sounds: Normal heart sounds, S1 normal and S2 normal.  Pulmonary:     Effort: Pulmonary effort is normal.     Breath sounds: Normal breath sounds.  Abdominal:     General: Abdomen is flat. Bowel sounds are normal.     Palpations: Abdomen is soft.     Tenderness: There is abdominal tenderness in the epigastric area. There is no right CVA tenderness, left CVA tenderness, guarding or rebound.  Genitourinary:    Testes: Normal.        Right: Mass, tenderness or swelling not present.        Left: Mass, tenderness or swelling not present.  Skin:    General:  Skin is warm and dry.  Neurological:     Mental Status: He is alert.     GCS: GCS eye subscore is 4. GCS verbal subscore is 5. GCS motor subscore is 6.  Psychiatric:        Speech: Speech normal.        Behavior: Behavior normal. Behavior is cooperative.    Assessment and Plan:   Pain in left testicle Exam normal, however will obtain u/s w/ doppler for further evaluation Update blood work Further management based on results   Nausea No red flags Will r/o significant testicular abnormality If ongoing, will likely trial OTC prilosec to help with possible gastritis Follow-up if new/worsening symptoms   I,Savera Zaman,acting as a scribe for [employer], PA.,have documented all relevant documentation on the behalf of Inda Coke, PA,as directed by  Inda Coke, PA while in the presence of Inda Coke, [place].    I, Inda Coke, [place], have reviewed all documentation for this visit. The documentation on 03/26/22 for the exam, diagnosis, procedures, and orders are all accurate and complete.  Inda Coke, PA-C

## 2022-03-26 NOTE — Patient Instructions (Signed)
It was great to see you!  We are getting an ultrasound to further investigate, as well as blood work.  If your testing is normal, we will likely have you trial a medication such as over the counter prilosec for your nausea/possible gastritis.  I will be in touch with all results and my recommendations.  Take care,  Inda Coke PA-C

## 2022-03-27 ENCOUNTER — Encounter: Payer: Self-pay | Admitting: Physician Assistant

## 2022-04-01 ENCOUNTER — Ambulatory Visit: Payer: 59 | Admitting: Family

## 2022-07-06 ENCOUNTER — Encounter: Payer: Self-pay | Admitting: *Deleted

## 2022-09-24 ENCOUNTER — Encounter: Payer: Self-pay | Admitting: *Deleted

## 2023-04-05 ENCOUNTER — Ambulatory Visit (INDEPENDENT_AMBULATORY_CARE_PROVIDER_SITE_OTHER): Payer: 59 | Admitting: Family

## 2023-04-05 ENCOUNTER — Encounter: Payer: Self-pay | Admitting: Family

## 2023-04-05 VITALS — BP 111/72 | Temp 98.0°F | Ht 72.0 in | Wt 181.2 lb

## 2023-04-05 DIAGNOSIS — Z23 Encounter for immunization: Secondary | ICD-10-CM

## 2023-04-05 DIAGNOSIS — Z114 Encounter for screening for human immunodeficiency virus [HIV]: Secondary | ICD-10-CM | POA: Diagnosis not present

## 2023-04-05 DIAGNOSIS — Z0000 Encounter for general adult medical examination without abnormal findings: Secondary | ICD-10-CM | POA: Diagnosis not present

## 2023-04-05 DIAGNOSIS — E782 Mixed hyperlipidemia: Secondary | ICD-10-CM

## 2023-04-05 DIAGNOSIS — Z1159 Encounter for screening for other viral diseases: Secondary | ICD-10-CM | POA: Diagnosis not present

## 2023-04-05 LAB — CBC WITH DIFFERENTIAL/PLATELET
Basophils Absolute: 0.1 10*3/uL (ref 0.0–0.1)
Basophils Relative: 1 % (ref 0.0–3.0)
Eosinophils Absolute: 0.1 10*3/uL (ref 0.0–0.7)
Eosinophils Relative: 2 % (ref 0.0–5.0)
HCT: 43.3 % (ref 39.0–52.0)
Hemoglobin: 14.7 g/dL (ref 13.0–17.0)
Lymphocytes Relative: 29.5 % (ref 12.0–46.0)
Lymphs Abs: 1.6 10*3/uL (ref 0.7–4.0)
MCHC: 33.9 g/dL (ref 30.0–36.0)
MCV: 93.9 fl (ref 78.0–100.0)
Monocytes Absolute: 0.6 10*3/uL (ref 0.1–1.0)
Monocytes Relative: 10.5 % (ref 3.0–12.0)
Neutro Abs: 3.2 10*3/uL (ref 1.4–7.7)
Neutrophils Relative %: 57 % (ref 43.0–77.0)
Platelets: 242 10*3/uL (ref 150.0–400.0)
RBC: 4.61 Mil/uL (ref 4.22–5.81)
RDW: 12.8 % (ref 11.5–15.5)
WBC: 5.5 10*3/uL (ref 4.0–10.5)

## 2023-04-05 LAB — LIPID PANEL
Cholesterol: 232 mg/dL — ABNORMAL HIGH (ref 0–200)
HDL: 44 mg/dL (ref >39.00)
LDL Cholesterol: 152 mg/dL — ABNORMAL HIGH (ref 0–99)
NonHDL: 187.99
Total CHOL/HDL Ratio: 5
Triglycerides: 178 mg/dL — ABNORMAL HIGH (ref 0.0–149.0)
VLDL: 35.6 mg/dL (ref 0.0–40.0)

## 2023-04-05 LAB — PSA: PSA: 0.68 ng/mL (ref 0.10–4.00)

## 2023-04-05 LAB — COMPREHENSIVE METABOLIC PANEL
ALT: 23 U/L (ref 0–53)
AST: 18 U/L (ref 0–37)
Albumin: 4.4 g/dL (ref 3.5–5.2)
Alkaline Phosphatase: 85 U/L (ref 39–117)
BUN: 11 mg/dL (ref 6–23)
CO2: 30 mEq/L (ref 19–32)
Calcium: 10.2 mg/dL (ref 8.4–10.5)
Chloride: 101 mEq/L (ref 96–112)
Creatinine, Ser: 0.92 mg/dL (ref 0.40–1.50)
GFR: 97.34 mL/min (ref >60.00)
Glucose, Bld: 95 mg/dL (ref 70–99)
Potassium: 4 mEq/L (ref 3.5–5.1)
Sodium: 138 mEq/L (ref 135–145)
Total Bilirubin: 1.4 mg/dL — ABNORMAL HIGH (ref 0.2–1.2)
Total Protein: 6.9 g/dL (ref 6.0–8.3)

## 2023-04-05 NOTE — Patient Instructions (Signed)
It was very nice to see you today!   I will review your lab results via MyChart in a few days.       PLEASE NOTE:  If you had any lab tests please let us know if you have not heard back within a few days. You may see your results on MyChart before we have a chance to review them but we will give you a call once they are reviewed by us. If we ordered any referrals today, please let us know if you have not heard from their office within the next week.    

## 2023-04-05 NOTE — Progress Notes (Signed)
Phone: [phone]  Subjective:  Patient 52 y.o. male presenting for annual physical.  Chief Complaint  Patient presents with   Annual Exam    Fasting w/ labs     See problem oriented charting- ROS- full  review of systems was completed and negative.   The following were reviewed and entered/updated in epic: Past Medical History:  Diagnosis Date   Allergy    Asthma    mild, worse as teenager   Eczema    Dr. Terri Piedra   H/O echocardiogram 2012   had chest pain/stress at that time, EKG abnormal; eval with echo and holter normal, cardiology, Dr. Allyson Sabal [facility]   Spermatocele of epididymis, single    right   Patient Active Problem List   Diagnosis Date Noted   Encounter to establish care 08/04/2021   Annual physical exam 07/31/2021   Fracture of metacarpal shaft of right hand, closed, initial encounter 08/23/2018   Chest tightness 07/28/2011   General medical examination 07/28/2011   Past Surgical History:  Procedure Laterality Date   APPENDECTOMY  10/13/1987   LACERATION REPAIR  10/13/1999   left forehead   OSTEOTOMY METACARPAL / FINGER Right    pinky   SKIN BIOPSY     Dr. Terri Piedra    Family History  Problem Relation Age of Onset   Inflammatory bowel disease Sister    Dementia Maternal Grandmother    COPD Maternal Grandfather        black lung   Heart disease Neg Hx    Stroke Neg Hx    Diabetes Neg Hx    Hypertension Neg Hx    Hyperlipidemia Neg Hx    Cancer Neg Hx    Colon cancer Neg Hx    Colon polyps Neg Hx    Esophageal cancer Neg Hx    Stomach cancer Neg Hx    Rectal cancer Neg Hx     Medications- reviewed and updated Current Outpatient Medications  Medication Sig Dispense Refill   Loratadine 10 MG CAPS      Multiple Vitamins-Minerals (MULTIVITAMIN WITH MINERALS) tablet Take 1 tablet by mouth daily.     No current facility-administered medications for this visit.    Allergies-reviewed and updated Allergies  Allergen Reactions   Neosporin  [Neomycin-Bacitracin Zn-Polymyx]     rash    Social History   Social History Narrative   Married, 1 child, son 51 years old with ADD, exercise - running, swimming, walking on the job, 3 sets of calisthenics.  Plays poker.  Catholic.  Emergency planning/management officer.    Objective:  BP 111/72 (BP Location: Left Arm, Patient Position: Sitting, Cuff Size: Large)   Temp 98 F (36.7 C) (Temporal)   Ht 6' (1.829 m)   Wt 181 lb 3.2 oz (82.2 kg)   SpO2 97%   BMI 24.58 kg/m  Physical Exam Vitals and nursing note reviewed.  Constitutional:      General: He is not in acute distress.    Appearance: Normal appearance.  HENT:     Head: Normocephalic.     Right Ear: Tympanic membrane and external ear normal.     Left Ear: Tympanic membrane and external ear normal.     Nose: Nose normal.     Mouth/Throat:     Mouth: Mucous membranes are moist.  Eyes:     Extraocular Movements: Extraocular movements intact.  Cardiovascular:     Rate and Rhythm: Normal rate and regular rhythm.  Pulmonary:     Effort: Pulmonary effort is  normal.     Breath sounds: Normal breath sounds.  Abdominal:     General: Abdomen is flat. There is no distension.     Palpations: Abdomen is soft.     Tenderness: There is no abdominal tenderness.  Musculoskeletal:        General: Normal range of motion.     Cervical back: Normal range of motion.  Skin:    General: Skin is warm and dry.  Neurological:     Mental Status: He is alert and oriented to person, place, and time.  Psychiatric:        Mood and Affect: Mood normal.        Behavior: Behavior normal.        Judgment: Judgment normal.      Assessment and Plan   Health Maintenance counseling: 1. Anticipatory guidance: Patient counseled regarding regular dental exams q6 months, eye exams yearly, avoiding smoking and second hand smoke, limiting alcohol to 2 beverages per day.   2. Risk factor reduction:  Advised patient of need for regular exercise and diet rich in fruits  and vegetables to reduce risk of heart attack and stroke. Exercise- daily.   Wt Readings from Last 3 Encounters:  04/05/23 181 lb 3.2 oz (82.2 kg)  03/26/22 182 lb 9.6 oz (82.8 kg)  01/21/22 180 lb (81.6 kg)   3. Immunizations/screenings/ancillary studies Immunization History  Administered Date(s) Administered   Influenza Split 08/29/2012   Tdap 03/20/2014   Health Maintenance Due  Topic Date Due   COVID-19 Vaccine (1) Never done   HIV Screening  Never done   Hepatitis C Screening  Never done   Zoster Vaccines- Shingrix (1 of 2) Never done    4. Prostate cancer screening >55yo - risk factors? checking today, no risk factors 5. Colon cancer screening: done 2023 6. Skin cancer screening-  advised regular sunscreen use. Denies worrisome, changing, or new skin lesions.  7. Smoking associated screening (lung cancer screening, AAA screen 65-75, UA)- non- smoker 8. STD screening - N/A 9. Alcohol screening: socially  Annual physical exam -     Comprehensive metabolic panel -     CBC with Differential/Platelet -     PSA  Mixed hyperlipidemia -     Lipid panel  Screening for HIV (human immunodeficiency virus) -     HIV Antibody (routine testing w rflx)  Need for hepatitis C screening test -     Hepatitis C antibody  Need for shingles vaccine -     Varicella-zoster vaccine IM   Recommended follow up:  No follow-ups on file. No future appointments.   Lab/Order associations:fasting    Dulce Sellar, NP

## 2023-04-06 LAB — HEPATITIS C ANTIBODY: Hepatitis C Ab: NONREACTIVE

## 2023-04-06 LAB — HIV ANTIBODY (ROUTINE TESTING W REFLEX): HIV 1&2 Ab, 4th Generation: NONREACTIVE

## 2023-05-12 ENCOUNTER — Encounter (INDEPENDENT_AMBULATORY_CARE_PROVIDER_SITE_OTHER): Payer: Self-pay

## 2023-06-07 ENCOUNTER — Ambulatory Visit: Payer: 59 | Admitting: Family

## 2023-06-07 ENCOUNTER — Encounter: Payer: Self-pay | Admitting: Family

## 2023-06-07 VITALS — BP 115/70 | HR 51 | Temp 98.2°F | Ht 73.8 in | Wt 188.8 lb

## 2023-06-07 DIAGNOSIS — E782 Mixed hyperlipidemia: Secondary | ICD-10-CM | POA: Diagnosis not present

## 2023-06-07 DIAGNOSIS — Z23 Encounter for immunization: Secondary | ICD-10-CM

## 2023-06-07 NOTE — Progress Notes (Signed)
   Patient ID: Oscar Ellis, male    DOB: 10-Apr-1971, 52 y.o.   MRN: [id number]  Chief Complaint  Patient presents with   Immunizations    Pt in office for 2nd shingles vaccine    HPI: Hyperlipidemia: Patient is currently maintained on the following medication for hyperlipidemia: None, lifestyle modifications.  Patient reports good compliance with low fat/low cholesterol diet.  Last lipid panel as follows: Lab Results  Component Value Date   CHOL 232 (H) 04/05/2023   HDL 44.00 04/05/2023   LDLCALC 152 (H) 04/05/2023   TRIG 178.0 (H) 04/05/2023   CHOLHDL 5 04/05/2023    Assessment & Plan:  Mixed hyperlipidemia Assessment & Plan: New labs done 6/24 TC 232, LDL 152 pt made changes to his diet, reports having lipids checked thru his work and they were back down, thinks TC was 184. advised to try and get uploaded to MyChart so we can tx into EMR f/u 1 yr   Immunization due -     Varicella-zoster vaccine IM   Subjective:    Outpatient Medications Prior to Visit  Medication Sig Dispense Refill   Multiple Vitamins-Minerals (MULTIVITAMIN WITH MINERALS) tablet Take 1 tablet by mouth daily.     Loratadine 10 MG CAPS  (Patient not taking: Reported on 06/07/2023)     No facility-administered medications prior to visit.   Past Medical History:  Diagnosis Date   Allergy    Asthma    mild, worse as teenager   Eczema    Dr. Terri Piedra   H/O echocardiogram 2012   had chest pain/stress at that time, EKG abnormal; eval with echo and holter normal, cardiology, Dr. Allyson Sabal [facility]   Spermatocele of epididymis, single    right   Past Surgical History:  Procedure Laterality Date   APPENDECTOMY  10/13/1987   LACERATION REPAIR  10/13/1999   left forehead   OSTEOTOMY METACARPAL / FINGER Right    pinky   SKIN BIOPSY     Dr. Terri Piedra   Allergies  Allergen Reactions   Neosporin [Neomycin-Bacitracin Zn-Polymyx]     rash      Objective:    Physical Exam Vitals and nursing note  reviewed.  Constitutional:      General: He is not in acute distress.    Appearance: Normal appearance.  HENT:     Head: Normocephalic.  Cardiovascular:     Rate and Rhythm: Normal rate and regular rhythm.  Pulmonary:     Effort: Pulmonary effort is normal.     Breath sounds: Normal breath sounds.  Musculoskeletal:        General: Normal range of motion.     Cervical back: Normal range of motion.  Skin:    General: Skin is warm and dry.  Neurological:     Mental Status: He is alert and oriented to person, place, and time.  Psychiatric:        Mood and Affect: Mood normal.    BP 115/70   Pulse (!) 51   Temp 98.2 F (36.8 C) (Temporal)   Ht 6' 1.8" (1.875 m)   Wt 188 lb 12.8 oz (85.6 kg)   SpO2 96%   BMI 24.37 kg/m  Wt Readings from Last 3 Encounters:  06/07/23 188 lb 12.8 oz (85.6 kg)  04/05/23 181 lb 3.2 oz (82.2 kg)  03/26/22 182 lb 9.6 oz (82.8 kg)      Dulce Sellar, NP

## 2023-06-07 NOTE — Assessment & Plan Note (Signed)
New labs done 6/24 TC 232, LDL 152 pt made changes to his diet, reports having lipids checked thru his work and they were back down, thinks TC was 184. advised to try and get uploaded to MyChart so we can tx into EMR f/u 1 yr

## 2023-06-08 ENCOUNTER — Encounter: Payer: Self-pay | Admitting: Family

## 2023-06-11 ENCOUNTER — Ambulatory Visit (INDEPENDENT_AMBULATORY_CARE_PROVIDER_SITE_OTHER)
Admission: RE | Admit: 2023-06-11 | Discharge: 2023-06-11 | Disposition: A | Discharge status: Home / Self Care | Payer: 59 | Source: Ambulatory Visit | Attending: Internal Medicine | Admitting: Internal Medicine

## 2023-06-11 ENCOUNTER — Ambulatory Visit: Payer: 59 | Admitting: Internal Medicine

## 2023-06-11 ENCOUNTER — Encounter: Payer: Self-pay | Admitting: Internal Medicine

## 2023-06-11 VITALS — BP 102/66 | HR 64 | Temp 97.7°F | Ht 73.8 in | Wt 188.4 lb

## 2023-06-11 DIAGNOSIS — M62838 Other muscle spasm: Secondary | ICD-10-CM

## 2023-06-11 DIAGNOSIS — M5412 Radiculopathy, cervical region: Secondary | ICD-10-CM

## 2023-06-11 DIAGNOSIS — M5030 Other cervical disc degeneration, unspecified cervical region: Secondary | ICD-10-CM | POA: Diagnosis not present

## 2023-06-11 DIAGNOSIS — M5000 Cervical disc disorder with myelopathy, unspecified cervical region: Secondary | ICD-10-CM

## 2023-06-11 MED ORDER — CELECOXIB 200 MG PO CAPS
200.0000 mg | ORAL_CAPSULE | Freq: Two times a day (BID) | ORAL | 0 refills | Status: DC
Start: 2023-06-11 — End: 2024-04-05

## 2023-06-11 MED ORDER — CYCLOBENZAPRINE HCL 10 MG PO TABS
10.0000 mg | ORAL_TABLET | Freq: Three times a day (TID) | ORAL | 0 refills | Status: DC | PRN
Start: 2023-06-11 — End: 2024-04-05

## 2023-06-11 MED ORDER — GABAPENTIN 300 MG PO CAPS
300.0000 mg | ORAL_CAPSULE | Freq: Three times a day (TID) | ORAL | 3 refills | Status: DC
Start: 2023-06-11 — End: 2024-04-05

## 2023-06-11 NOTE — Patient Instructions (Addendum)
VISIT SUMMARY:  Dear Mr. Toni, during your visit, we discussed your ongoing neck pain and left arm numbness that you've been experiencing for the past three weeks. We suspect that these symptoms may be due to a pinched nerve in your neck, possibly related to a past injury. We have planned several steps to confirm this diagnosis and manage your symptoms.  YOUR PLAN:  -NECK PAIN AND LEFT ARM NUMBNESS: We believe your symptoms may be due to a condition called Cervical Radiculopathy, which is a pinched nerve in the neck. This can cause pain and numbness in the neck and arm. We will order a cervical spine x-ray to check for any disc damage. We have also prescribed physical therapy to help manage your pain and improve your neck mobility. Medications have been prescribed to help with nerve pain and muscle spasms at night, and to manage pain and inflammation during the day. We recommend using cold packs for flare-ups and heat for muscle relaxation, but caution against sleeping on a heating pad. Please avoid heavy lifting and neck stretching until you have consulted with a physical therapist.  INSTRUCTIONS:  Please follow the prescribed medication regimen: Gabapentin for nerve pain and Flexeril for muscle spasms at night, and Celebrex for daytime pain and inflammation. Start physical therapy as soon as possible. Use cold packs for flare-ups and heat for muscle relaxation. Avoid heavy lifting and neck stretching until you have consulted with a physical therapist. If your symptoms worsen or you experience new symptoms like weakness, numbness, or loss of bowel or bladder function, seek immediate medical attention.  It was a pleasure seeing you today! Your health and satisfaction are our top priorities.   Glenetta Hew, MD  Next Steps:  [x]  Flexible Follow-Up: We recommend a follow up with your primary care, a specialist, or me within 1-2 weeks for ensuring your problem resolves. This allows for progress  monitoring and treatment adjustments. [x]  Early Intervention: Schedule sooner appointment, call our on-call services, or go to emergency room if there is Increase in pain or discomfort New or worsening symptoms Sudden or severe changes in your health [x]  Lab & X-ray Appointments: complete or schedule to complete today, or call to schedule.  X-rays: [facility] (M-F, 8:30am-noon or 1pm-5pm).  Making the Most of Our Focused (20 minute) Follow Up Appointments:  [x]   Clearly state your top concerns at the beginning of the visit to focus our discussion [x]   If you anticipate you will need more time, please inform the front desk during scheduling - we can book multiple appointments in the same week. [x]   If you have transportation problems- use our convenient video appointments or ask about transportation support. [x]   We can get down to business faster if you use MyChart to update information before the visit and submit non-urgent questions before your visit. Thank you for taking the time to provide details through MyChart.  Let our nurse know and she can import this information into your encounter documents.  Arrival and Wait Times: [x]   Arriving on time ensures that everyone receives prompt attention. [x]   Early morning (8a) and afternoon (1p) appointments tend to have shortest wait times. [x]   Unfortunately, we cannot delay appointments for late arrivals or hold slots during phone calls.  Getting Answers and Following Up  [x]   Simple Questions & Concerns: For quick questions or basic follow-up after your visit, reach [place] at [phone] or MyChart messaging. [x]   Complex Concerns: If your concern  is more complex, scheduling an appointment might be best. Discuss this with the staff to find the most suitable option. [x]   Lab & Imaging Results: We'll contact you directly if results are abnormal or you don't use MyChart. Most normal results will be on MyChart within 2-3 business  days, with a review message from Dr. Jon Billings. Haven't heard back in 2 weeks? Need results sooner? Contact us at [phone]. [x]   Referrals: Our referral coordinator will manage specialist referrals. The specialist's office should contact you within 2 weeks to schedule an appointment. Call us if you haven't heard from them after 2 weeks.  Staying Connected  [x]   MyChart: Activate your MyChart for the fastest way to access results and message [place]. See the last page of this paperwork for instructions on how to activate.  Bring to Your Next Appointment  [x]   Medications: Please bring all your medication bottles to your next appointment to ensure we have an accurate record of your prescriptions. [x]   Health Diaries: If you're monitoring any health conditions at home, keeping a diary of your readings can be very helpful for discussions at your next appointment.  Billing  [x]   X-ray & Lab Orders: These are billed by separate companies. Contact the invoicing company directly for questions or concerns. [x]   Visit Charges: Discuss any billing inquiries with our administrative services team.  Your Satisfaction Matters  [x]   Share Your Experience: We strive for your satisfaction! If you have any complaints, or preferably compliments, please let Dr. Jon Billings know directly or contact our Practice Administrators, Edwena Felty or [employer], by asking at the front desk.   Reviewing Your Records  [x]   Review this early draft of your clinical encounter notes below and the final encounter summary tomorrow on MyChart after its been completed.   Cervical radiculopathy -     DG Cervical Spine Complete; Future -     DG Cerv Spine Flex&Ext Only; Future -     Gabapentin; Take 1 capsule (300 mg total) by mouth 3 (three) times daily.  Dispense: 90 capsule; Refill: 3 -     Cyclobenzaprine HCl; Take 1 tablet (10 mg total) by mouth 3 (three) times daily as needed for muscle spasms.  Dispense: 30 tablet;  Refill: 0 -     Celecoxib; Take 1 capsule (200 mg total) by mouth 2 (two) times daily.  Dispense: 30 capsule; Refill: 0 -     Ambulatory referral to Physical Therapy  Degeneration of cervical intervertebral disc  Intervertebral cervical disc disorder with myelopathy, cervical region  Muscle spasm
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
## 2023-06-15 ENCOUNTER — Ambulatory Visit: Payer: 59 | Admitting: Family

## 2023-06-17 ENCOUNTER — Ambulatory Visit: Payer: 59 | Admitting: Physical Therapy

## 2023-06-17 ENCOUNTER — Encounter: Payer: Self-pay | Admitting: Physical Therapy

## 2023-06-17 DIAGNOSIS — M5412 Radiculopathy, cervical region: Secondary | ICD-10-CM | POA: Diagnosis not present

## 2023-06-17 NOTE — Therapy (Signed)
OUTPATIENT PHYSICAL THERAPY UPPER EXTREMITY EVALUATION    Patient Name: Oscar Ellis MRN: [id number] DOB:January 15, 1971, 52 y.o., male Today's Date: 06/17/2023  END OF SESSION:  PT End of Session - 06/17/23 0925     Visit Number 1    Number of Visits 16    Date for PT Re-Evaluation 08/12/23    Authorization Type [employer]    PT Start Time 0802    PT Stop Time 0844    PT Time Calculation (min) 42 min    Activity Tolerance Patient tolerated treatment well    Behavior During Therapy [facility] for tasks assessed/performed             Past Medical History:  Diagnosis Date   Allergy    Asthma    mild, worse as teenager   Eczema    Dr. Terri Piedra   H/O echocardiogram 2012   had chest pain/stress at that time, EKG abnormal; eval with echo and holter normal, cardiology, Dr. Allyson Sabal [facility]   Spermatocele of epididymis, single    right   Past Surgical History:  Procedure Laterality Date   APPENDECTOMY  10/13/1987   LACERATION REPAIR  10/13/1999   left forehead   OSTEOTOMY METACARPAL / FINGER Right    pinky   SKIN BIOPSY     Dr. Terri Piedra   Patient Active Problem List   Diagnosis Date Noted   Mixed hyperlipidemia 06/07/2023   Encounter to establish care 08/04/2021   Annual physical exam 07/31/2021   Fracture of metacarpal shaft of right hand, closed, initial encounter 08/23/2018   Chest tightness 07/28/2011    PCP: Dulce Sellar  REFERRING PROVIDER: Glenetta Hew  REFERRING DIAG: cervical radiculopathy   THERAPY DIAG:  Radiculopathy, cervical region  Rationale for Evaluation and Treatment: Rehabilitation  ONSET DATE: 3 weeks ago   SUBJECTIVE:                                                                                                                                                                                      SUBJECTIVE STATEMENT:  Pt states new onset of L sided neck pain With numbness in L arm into all fingers. States more of a numbness sensation vs tingling. He  has had pain for about 3 weeks, does feel that pain is better than it was last week. He was given muscle relaxers and took for a few days, but is no longer taking. He has had some muscle tension/pain in neck previously, but not to this degree, and not with pain into UE. 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 They do have a fitness center at work that he uses to work  out daily. Has been using heating pad.   Hand dominance: Left, but uses  R for most gross motor activities.   PERTINENT HISTORY: none   PAIN:  Are you having pain? Yes: NPRS scale: 2-3/10  was up to 7/10 2 weeks ago.  Pain location: L side of neck, numbness into L UE  Pain description: sore, painful and limited L rotation.  Aggravating factors: cervical flexion, L rotation Relieving factors: none stated   PRECAUTIONS: None  RED FLAGS: None   WEIGHT BEARING RESTRICTIONS: No  FALLS:  Has patient fallen in last 6 months? No   PLOF: Independent  PATIENT GOALS: Decreased pain  NEXT MD VISIT:   OBJECTIVE:   DIAGNOSTIC FINDINGS:    PATIENT SURVEYS :  FOTO   COGNITION: Overall cognitive status: Within functional limits for tasks assessed     SENSATION: WFL  POSTURE: wfl   UPPER EXTREMITY ROM:  Shoulders: WFL Neck:   flexion: mild limitation/ pain  Ext: wfl  - no pian  L rotation- 65 deg- pain  R rotation- 70 deg, tightness     UPPER EXTREMITY MMT:  MMT Right eval Left eval  Shoulder flexion 4 5  Shoulder extension    Shoulder abduction 4 5  Shoulder adduction    Shoulder internal rotation 5   Shoulder external rotation 5   Middle trapezius    Lower trapezius    Elbow flexion    Elbow extension    Wrist flexion    Wrist extension    Wrist ulnar deviation    Wrist radial deviation    Wrist pronation    Wrist supination    Grip strength (lbs)    (Blank rows = not tested)   PALPATION:  Pain at L mid c-spine with palpation,  Tightness in L cervical paraspinals and UT, into L  levator and rhomboid.  Supine: L rotation PROM, mild limitation with Pain at end range.     TODAY'S TREATMENT:                                                                                                                                         DATE:   06/17/2023  Therapeutic Exercise: Aerobic: Supine:  cervical rotation x 5 bil;  Seated:  cervical rotation x 5 bil;  ext 2 x 5  Standing: Stretches:  L UT stretch 15 sec x 3 ;   Neuromuscular Re-education: Manual Therapy:   STM to Bil cervical paraspinals and L UT,  light PA mobs for mid c-spine;  Light manual distraction 10 sec x 10;  Therapeutic Activity: Self Care: reviewed seated posture for work, and recommendation to limit flexion and L SB if painful.   PATIENT EDUCATION:  Education details: PT POC, Exam findings, HEP Person educated: Patient Education method: Explanation, Demonstration, Tactile cues, Verbal cues, and Handouts Education comprehension: verbalized understanding, returned demonstration, verbal cues required, tactile cues  required, and needs further education   HOME EXERCISE PROGRAM: Access Code: [email or web address] URL: [email or web address] Date: 06/17/2023 Prepared by: Sedalia Muta  Exercises - Seated Cervical Sidebending Stretch  - 2 x daily - 3 reps - 15 hold - Supine Cervical Rotation AROM on Pillow  - 2 x daily - 1 sets - 5-10 reps - 3 sec hold - Seated Cervical Rotation AROM  - 1 x daily - 1 sets - 10 reps - 3 sec  hold  ASSESSMENT:  CLINICAL IMPRESSION: Eval:  Patient presents with primary complaint of pain in L side of neck and numbness into L UE, symptoms consistent with cervical radiculopathy. Pts symptoms already improving over the last week. He has mostly constant numbness sensation in UE and into fingers. He has most pain in central/L cervical region, with cervical ROM. He also has increased muscle tension in L cervical musculature. Pt with decreased ability for  full functional  activities, reaching, lifting, work duties and  IADLs. Pt to benefit from skilled PT to improve deficits and pain.    OBJECTIVE IMPAIRMENTS: decreased activity tolerance, decreased mobility, decreased ROM, decreased strength, increased muscle spasms, impaired flexibility, impaired UE functional use, and pain.   ACTIVITY LIMITATIONS: carrying, lifting, reach over head, and locomotion level  PARTICIPATION LIMITATIONS: meal prep, cleaning, laundry, driving, community activity, and yard work  PERSONAL FACTORS:  none   are also affecting patient's functional outcome.   REHAB POTENTIAL: Good  CLINICAL DECISION MAKING: Stable/uncomplicated  EVALUATION COMPLEXITY: Low  GOALS: Goals reviewed with patient? Yes   SHORT TERM GOALS: Target date: 07/08/2023    Pt to be intendment with initial HEP  Goal status: INITIAL  2.  Pt to report numbness in L UE to be intermittent, less than 50 % of the time.   Goal status: INITIAL    LONG TERM GOALS: Target date: 08/12/2023    Pt to be independent with final HEP  Goal status: INITIAL  2.  Pt to demo improved ROM of neck to be [facility] and pain free, to improve ability for ADLs.   Goal status: INITIAL  3.  Pt to report pain in neck and UE to be 0-2/10 with activity .   Goal status: INITIAL  4.  Pt to report ability for job duties and exercise to be at least 75 % improved overall.   Goal status: INITIAL    PLAN: PT FREQUENCY: 1-2x/week  PT DURATION: 8 weeks  PLANNED INTERVENTIONS: Therapeutic exercises, Therapeutic activity, Neuromuscular re-education, Patient/Family education, Self Care, Joint mobilization, Joint manipulation, Stair training, DME instructions, Aquatic Therapy, Dry Needling, Electrical stimulation, Cryotherapy, Moist heat, Taping, Ultrasound, Ionotophoresis 4mg /ml Dexamethasone, Manual therapy,  Vasopneumatic device, Traction, Spinal manipulation, Spinal mobilization,    PLAN FOR NEXT SESSION:  manual for L UT and  neck muscle tension, manual traction, dry needling as needed, posture education and strengthening.  Improve L rotation and pain.   Sedalia Muta, PT, DPT 9:48 AM  06/17/23

## 2023-06-18 ENCOUNTER — Ambulatory Visit: Payer: 59 | Admitting: Family

## 2023-06-21 ENCOUNTER — Encounter: Payer: Self-pay | Admitting: Internal Medicine

## 2023-06-21 DIAGNOSIS — M47812 Spondylosis without myelopathy or radiculopathy, cervical region: Secondary | ICD-10-CM | POA: Insufficient documentation

## 2023-06-21 DIAGNOSIS — M5030 Other cervical disc degeneration, unspecified cervical region: Secondary | ICD-10-CM | POA: Insufficient documentation
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
## 2023-06-23 ENCOUNTER — Encounter: Payer: Self-pay | Admitting: Physical Therapy

## 2023-06-23 ENCOUNTER — Ambulatory Visit: Payer: 59 | Admitting: Physical Therapy

## 2023-06-23 DIAGNOSIS — M5412 Radiculopathy, cervical region: Secondary | ICD-10-CM | POA: Diagnosis not present

## 2023-06-23 NOTE — Therapy (Signed)
OUTPATIENT PHYSICAL THERAPY UPPER EXTREMITY TREATMENT     Patient Name: Oscar Ellis MRN: [id number] DOB:1971-03-09, 52 y.o., male Today's Date: 06/23/2023  END OF SESSION:  PT End of Session - 06/23/23 0802     Visit Number 2    Number of Visits 16    Date for PT Re-Evaluation 08/12/23    Authorization Type [employer]    PT Start Time 0803    PT Stop Time 0839    PT Time Calculation (min) 36 min    Activity Tolerance Patient tolerated treatment well    Behavior During Therapy [facility] for tasks assessed/performed             Past Medical History:  Diagnosis Date   Allergy    Asthma    mild, worse as teenager   Eczema    Dr. Terri Piedra   H/O echocardiogram 2012   had chest pain/stress at that time, EKG abnormal; eval with echo and holter normal, cardiology, Dr. Allyson Sabal [facility]   Spermatocele of epididymis, single    right   Past Surgical History:  Procedure Laterality Date   APPENDECTOMY  10/13/1987   LACERATION REPAIR  10/13/1999   left forehead   OSTEOTOMY METACARPAL / FINGER Right    pinky   SKIN BIOPSY     Dr. Terri Piedra   Patient Active Problem List   Diagnosis Date Noted   Cervical spondylosis 06/21/2023   Degenerative disc disease, cervical 06/21/2023   Mixed hyperlipidemia 06/07/2023   Encounter to establish care 08/04/2021   Annual physical exam 07/31/2021   Fracture of metacarpal shaft of right hand, closed, initial encounter 08/23/2018   Chest tightness 07/28/2011    PCP: Dulce Sellar  REFERRING PROVIDER: Glenetta Hew  REFERRING DIAG: cervical radiculopathy   THERAPY DIAG:  Radiculopathy, cervical region  Rationale for Evaluation and Treatment: Rehabilitation  ONSET DATE: 3 weeks ago   SUBJECTIVE:                                                                                                                                                                                      SUBJECTIVE STATEMENT: Pt has been doing HEP. Arm/fingers still feel  numb/different from R side. Continues to have L sided neck pain.   Eval: Pt states new onset of L sided neck pain With numbness in L arm into all fingers. States more of a numbness sensation vs tingling. He has had pain for about 3 weeks, does feel that pain is better than it was last week. He was given muscle relaxers and took for a few days, but is no longer taking. He has had some muscle tension/pain in neck previously,  but not to this degree, and not with pain into UE. 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 They do have a fitness center at work that he uses to work out daily. Has been using heating pad.   Hand dominance: Left, but uses  R for most gross motor activities.   PERTINENT HISTORY: none   PAIN:  Are you having pain? Yes: NPRS scale: 2-3/10  was up to 7/10 2 weeks ago.  Pain location: L side of neck, numbness into L UE  Pain description: sore, painful and limited L rotation.  Aggravating factors: cervical flexion, L rotation Relieving factors: none stated   PRECAUTIONS: None  RED FLAGS: None   WEIGHT BEARING RESTRICTIONS: No  FALLS:  Has patient fallen in last 6 months? No   PLOF: Independent  PATIENT GOALS: Decreased pain  NEXT MD VISIT:   OBJECTIVE:   DIAGNOSTIC FINDINGS:    PATIENT SURVEYS :  FOTO   COGNITION: Overall cognitive status: Within functional limits for tasks assessed     SENSATION: WFL  POSTURE: wfl   UPPER EXTREMITY ROM:  Shoulders: WFL Neck:   flexion: mild limitation/ pain  Ext: wfl  - no pian  L rotation- 65 deg- pain  R rotation- 70 deg, tightness     UPPER EXTREMITY MMT:  MMT Right eval Left eval  Shoulder flexion 4 5  Shoulder extension    Shoulder abduction 4 5  Shoulder adduction    Shoulder internal rotation 5   Shoulder external rotation 5   Middle trapezius    Lower trapezius    Elbow flexion    Elbow extension    Wrist flexion    Wrist extension    Wrist ulnar deviation    Wrist radial  deviation    Wrist pronation    Wrist supination    Grip strength (lbs)    (Blank rows = not tested)   PALPATION:  Pain at L mid c-spine with palpation,  Tightness in L cervical paraspinals and UT, into L levator and rhomboid.  Supine: L rotation PROM, mild limitation with Pain at end range.     TODAY'S TREATMENT:                                                                                                                                         DATE:   06/23/2023 Therapeutic Exercise: Aerobic: Supine:  Seated:  cervical rotation x 5 bil with overpressure to L;  shoulder rolls x 10;   Standing:  Stretches:  L UT stretch 15 sec x 3 ;  pec stretch with nerve glide 2 x 10;  Neuromuscular Re-education: Manual Therapy:   STM to Bil cervical paraspinals and L UT,  light PA mobs for mid c-spine;   manual distraction 10 sec x 10;  PROm for L rotation  Therapeutic Activity: Self Care:     Therapeutic  Exercise: Aerobic: Supine:  cervical rotation x 5 bil;  Seated:  cervical rotation x 5 bil;  ext 2 x 5  Standing: Stretches:  L UT stretch 15 sec x 3 ;   Neuromuscular Re-education: Manual Therapy:   STM to Bil cervical paraspinals and L UT,  light PA mobs for mid c-spine;  Light manual distraction 10 sec x 10;  Therapeutic Activity: Self Care: reviewed seated posture for work, and recommendation to limit flexion and L SB if painful.   PATIENT EDUCATION:  Education details: reviewed HEP and home management  Person educated: Patient Education method: Explanation, Demonstration, Tactile cues, Verbal cues, and Handouts Education comprehension: verbalized understanding, returned demonstration, verbal cues required, tactile cues required, and needs further education   HOME EXERCISE PROGRAM: Access Code: [email or web address] URL: [email or web address] Date: 06/17/2023 Prepared by: Sedalia Muta  Exercises - Seated Cervical Sidebending Stretch  - 2 x daily - 3 reps - 15  hold - Supine Cervical Rotation AROM on Pillow  - 2 x daily - 1 sets - 5-10 reps - 3 sec hold - Seated Cervical Rotation AROM  - 1 x daily - 1 sets - 10 reps - 3 sec  hold  ASSESSMENT:  CLINICAL IMPRESSION: 06/23/2023 Pt with mild improvements of L rotation and pain with L rotation after session today, and repeated motions to L. He has good tolerance for manual, but continues to have symptoms in UE. May benefit from dry needling for L UT tightness.   Eval:  Patient presents with primary complaint of pain in L side of neck and numbness into L UE, symptoms consistent with cervical radiculopathy. Pts symptoms already improving over the last week. He has mostly constant numbness sensation in UE and into fingers. He has most pain in central/L cervical region, with cervical ROM. He also has increased muscle tension in L cervical musculature. Pt with decreased ability for  full functional activities, reaching, lifting, work duties and  IADLs. Pt to benefit from skilled PT to improve deficits and pain.    OBJECTIVE IMPAIRMENTS: decreased activity tolerance, decreased mobility, decreased ROM, decreased strength, increased muscle spasms, impaired flexibility, impaired UE functional use, and pain.   ACTIVITY LIMITATIONS: carrying, lifting, reach over head, and locomotion level  PARTICIPATION LIMITATIONS: meal prep, cleaning, laundry, driving, community activity, and yard work  PERSONAL FACTORS:  none   are also affecting patient's functional outcome.   REHAB POTENTIAL: Good  CLINICAL DECISION MAKING: Stable/uncomplicated  EVALUATION COMPLEXITY: Low  GOALS: Goals reviewed with patient? Yes   SHORT TERM GOALS: Target date: 07/08/2023    Pt to be intendment with initial HEP  Goal status: INITIAL  2.  Pt to report numbness in L UE to be intermittent, less than 50 % of the time.   Goal status: INITIAL    LONG TERM GOALS: Target date: 08/12/2023    Pt to be independent with final  HEP  Goal status: INITIAL  2.  Pt to demo improved ROM of neck to be [facility] and pain free, to improve ability for ADLs.   Goal status: INITIAL  3.  Pt to report pain in neck and UE to be 0-2/10 with activity .   Goal status: INITIAL  4.  Pt to report ability for job duties and exercise to be at least 75 % improved overall.   Goal status: INITIAL    PLAN: PT FREQUENCY: 1-2x/week  PT DURATION: 8 weeks  PLANNED INTERVENTIONS: Therapeutic exercises, Therapeutic activity, Neuromuscular re-education, Patient/Family education, Self Care, Joint  mobilization, Joint manipulation, Stair training, DME instructions, Aquatic Therapy, Dry Needling, Electrical stimulation, Cryotherapy, Moist heat, Taping, Ultrasound, Ionotophoresis 4mg /ml Dexamethasone, Manual therapy,  Vasopneumatic device, Traction, Spinal manipulation, Spinal mobilization,    PLAN FOR NEXT SESSION:  manual for L UT and neck muscle tension, manual traction, dry needling as needed, posture education and strengthening.  Improve L rotation and pain.   Sedalia Muta, PT, DPT 8:40 AM  06/23/23

## 2023-06-24 ENCOUNTER — Ambulatory Visit: Payer: 59 | Admitting: Physical Therapy

## 2023-06-24 ENCOUNTER — Encounter: Payer: Self-pay | Admitting: Physical Therapy

## 2023-06-24 DIAGNOSIS — M5412 Radiculopathy, cervical region: Secondary | ICD-10-CM | POA: Diagnosis not present

## 2023-06-24 NOTE — Therapy (Signed)
OUTPATIENT PHYSICAL THERAPY UPPER EXTREMITY TREATMENT     Patient Name: Oscar Ellis MRN: [id number] DOB:06-Aug-1971, 52 y.o., male Today's Date: 06/24/2023  END OF SESSION:  PT End of Session - 06/24/23 1211     Visit Number 3    Number of Visits 16    Date for PT Re-Evaluation 08/12/23    Authorization Type U[employer]    PT Start Time [phone]    PT Stop Time 1020    PT Time Calculation (min) 43 min    Activity Tolerance Patient tolerated treatment well    Behavior During Therapy [facility] for tasks assessed/performed              Past Medical History:  Diagnosis Date   Allergy    Asthma    mild, worse as teenager   Eczema    Dr. Terri Piedra   H/O echocardiogram 2012   had chest pain/stress at that time, EKG abnormal; eval with echo and holter normal, cardiology, Dr. Allyson Sabal [facility]   Spermatocele of epididymis, single    right   Past Surgical History:  Procedure Laterality Date   APPENDECTOMY  10/13/1987   LACERATION REPAIR  10/13/1999   left forehead   OSTEOTOMY METACARPAL / FINGER Right    pinky   SKIN BIOPSY     Dr. Terri Piedra   Patient Active Problem List   Diagnosis Date Noted   Cervical spondylosis 06/21/2023   Degenerative disc disease, cervical 06/21/2023   Mixed hyperlipidemia 06/07/2023   Encounter to establish care 08/04/2021   Annual physical exam 07/31/2021   Fracture of metacarpal shaft of right hand, closed, initial encounter 08/23/2018   Chest tightness 07/28/2011    PCP: Dulce Sellar  REFERRING PROVIDER: Glenetta Hew  REFERRING DIAG: cervical radiculopathy   THERAPY DIAG:  Radiculopathy, cervical region  Rationale for Evaluation and Treatment: Rehabilitation  ONSET DATE: 3 weeks ago   SUBJECTIVE:                                                                                                                                                                                      SUBJECTIVE STATEMENT: Pt has been doing HEP. . Continues to have L  sided neck pain. Feels arm is a bit less constant.   Eval: Pt states new onset of L sided neck pain With numbness in L arm into all fingers. States more of a numbness sensation vs tingling. He has had pain for about 3 weeks, does feel that pain is better than it was last week. He was given muscle relaxers and took for a few days, but is no longer taking. He has had some muscle tension/pain in  neck previously, but not to this degree, and not with pain into UE. 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 They do have a fitness center at work that he uses to work out daily. Has been using heating pad.   Hand dominance: Left, but uses  R for most gross motor activities.   PERTINENT HISTORY: none   PAIN:  Are you having pain? Yes: NPRS scale: 2-3/10  was up to 7/10 2 weeks ago.  Pain location: L side of neck, numbness into L UE  Pain description: sore, painful and limited L rotation.  Aggravating factors: cervical flexion, L rotation Relieving factors: none stated   PRECAUTIONS: None  RED FLAGS: None   WEIGHT BEARING RESTRICTIONS: No  FALLS:  Has patient fallen in last 6 months? No   PLOF: Independent  PATIENT GOALS: Decreased pain  NEXT MD VISIT:   OBJECTIVE:   DIAGNOSTIC FINDINGS:    PATIENT SURVEYS :  FOTO   COGNITION: Overall cognitive status: Within functional limits for tasks assessed     SENSATION: WFL  POSTURE: wfl   UPPER EXTREMITY ROM:  Shoulders: WFL Neck:   flexion: mild limitation/ pain  Ext: wfl  - no pian  L rotation- 65 deg- pain  R rotation- 70 deg, tightness     UPPER EXTREMITY MMT:  MMT Right eval Left eval  Shoulder flexion 4 5  Shoulder extension    Shoulder abduction 4 5  Shoulder adduction    Shoulder internal rotation 5   Shoulder external rotation 5   Middle trapezius    Lower trapezius    Elbow flexion    Elbow extension    Wrist flexion    Wrist extension    Wrist ulnar deviation    Wrist radial deviation    Wrist  pronation    Wrist supination    Grip strength (lbs)    (Blank rows = not tested)   PALPATION:  Pain at L mid c-spine with palpation,  Tightness in L cervical paraspinals and UT, into L levator and rhomboid.  Supine: L rotation PROM, mild limitation with Pain at end range.     TODAY'S TREATMENT:                                                                                                                                         DATE:  06/24/2023 Therapeutic Exercise: Aerobic: Supine:  Seated:  cervical rotation x 5 bil ; chin tucks x 10;  chin tuck with extension x 10;  Standing:  Stretches:  L UT stretch 15 sec x 3 ;  pec stretch with nerve glide 2 x 10;  Neuromuscular Re-education: Manual Therapy:   STM to Bil cervical paraspinals and L UT,  light PA mobs for mid c-spine; side glides;   manual distraction 10 sec x 10;   PROm for L rotation  Trigger Point Dry-Needling  Treatment instructions: Expect mild to moderate muscle soreness. S/S of pneumothorax if dry needled over a lung field, and to seek immediate medical attention should they occur. Patient verbalized understanding of these instructions and education.  Patient Consent Given: Yes Education handout provided: Yes Muscles treated: L UT  Electrical stimulation performed: No Parameters: N/A Treatment response/outcome: palpable increase in muscle length, twitch response.  Modalities: Moist heat pack to L neck and UT x 8 min at end of session.      Previous:  Therapeutic Exercise: Aerobic: Supine:  Seated:  cervical rotation x 5 bil with overpressure to L;  shoulder rolls x 10;   Standing:  Stretches:  L UT stretch 15 sec x 3 ;  pec stretch with nerve glide 2 x 10;  Neuromuscular Re-education: Manual Therapy:   STM to Bil cervical paraspinals and L UT,  light PA mobs for mid c-spine;   manual distraction 10 sec x 10;  PROm for L rotation  Therapeutic Activity: Self Care:     Therapeutic  Exercise: Aerobic: Supine:  cervical rotation x 5 bil;  Seated:  cervical rotation x 5 bil;  ext 2 x 5  Standing: Stretches:  L UT stretch 15 sec x 3 ;   Neuromuscular Re-education: Manual Therapy:   STM to Bil cervical paraspinals and L UT,  light PA mobs for mid c-spine;  Light manual distraction 10 sec x 10;  Therapeutic Activity: Self Care: reviewed seated posture for work, and recommendation to limit flexion and L SB if painful.   PATIENT EDUCATION:  Education details: reviewed HEP and home management  Person educated: Patient Education method: Explanation, Demonstration, Tactile cues, Verbal cues, and Handouts Education comprehension: verbalized understanding, returned demonstration, verbal cues required, tactile cues required, and needs further education   HOME EXERCISE PROGRAM: Access Code: [email or web address] URL: [email or web address] Date: 06/17/2023 Prepared by: Sedalia Muta  Exercises - Seated Cervical Sidebending Stretch  - 2 x daily - 3 reps - 15 hold - Supine Cervical Rotation AROM on Pillow  - 2 x daily - 1 sets - 5-10 reps - 3 sec hold - Seated Cervical Rotation AROM  - 1 x daily - 1 sets - 10 reps - 3 sec  hold  ASSESSMENT:  CLINICAL IMPRESSION: 06/24/2023 Continued focus on manual for muscle tension relief and nerve tension relief. Added dry needling today for UT tightness, pt with good tolerance, will assess effects next visit. Pt to benefit from continued care.  Eval:  Patient presents with primary complaint of pain in L side of neck and numbness into L UE, symptoms consistent with cervical radiculopathy. Pts symptoms already improving over the last week. He has mostly constant numbness sensation in UE and into fingers. He has most pain in central/L cervical region, with cervical ROM. He also has increased muscle tension in L cervical musculature. Pt with decreased ability for  full functional activities, reaching, lifting, work duties and  IADLs. Pt to  benefit from skilled PT to improve deficits and pain.    OBJECTIVE IMPAIRMENTS: decreased activity tolerance, decreased mobility, decreased ROM, decreased strength, increased muscle spasms, impaired flexibility, impaired UE functional use, and pain.   ACTIVITY LIMITATIONS: carrying, lifting, reach over head, and locomotion level  PARTICIPATION LIMITATIONS: meal prep, cleaning, laundry, driving, community activity, and yard work  PERSONAL FACTORS:  none   are also affecting patient's functional outcome.   REHAB POTENTIAL: Good  CLINICAL DECISION MAKING: Stable/uncomplicated  EVALUATION COMPLEXITY: Low  GOALS: Goals reviewed with patient? Yes  SHORT TERM GOALS: Target date: 07/08/2023    Pt to be intendment with initial HEP  Goal status: INITIAL  2.  Pt to report numbness in L UE to be intermittent, less than 50 % of the time.   Goal status: INITIAL    LONG TERM GOALS: Target date: 08/12/2023    Pt to be independent with final HEP  Goal status: INITIAL  2.  Pt to demo improved ROM of neck to be [facility] and pain free, to improve ability for ADLs.   Goal status: INITIAL  3.  Pt to report pain in neck and UE to be 0-2/10 with activity .   Goal status: INITIAL  4.  Pt to report ability for job duties and exercise to be at least 75 % improved overall.   Goal status: INITIAL    PLAN: PT FREQUENCY: 1-2x/week  PT DURATION: 8 weeks  PLANNED INTERVENTIONS: Therapeutic exercises, Therapeutic activity, Neuromuscular re-education, Patient/Family education, Self Care, Joint mobilization, Joint manipulation, Stair training, DME instructions, Aquatic Therapy, Dry Needling, Electrical stimulation, Cryotherapy, Moist heat, Taping, Ultrasound, Ionotophoresis 4mg /ml Dexamethasone, Manual therapy,  Vasopneumatic device, Traction, Spinal manipulation, Spinal mobilization,    PLAN FOR NEXT SESSION:  manual for L UT and neck muscle tension, manual traction, dry needling as needed,  posture education and strengthening.  Improve L rotation and pain.   Sedalia Muta, PT, DPT 12:12 PM  06/24/23

## 2023-06-29 ENCOUNTER — Ambulatory Visit: Payer: 59 | Admitting: Physical Therapy

## 2023-06-29 ENCOUNTER — Encounter: Payer: Self-pay | Admitting: Physical Therapy

## 2023-06-29 DIAGNOSIS — M5412 Radiculopathy, cervical region: Secondary | ICD-10-CM | POA: Diagnosis not present

## 2023-06-29 NOTE — Therapy (Signed)
OUTPATIENT PHYSICAL THERAPY UPPER EXTREMITY TREATMENT     Patient Name: Oscar Ellis MRN: [id number] DOB:August 16, 1971, 52 y.o., male Today's Date: 06/29/2023  END OF SESSION:  PT End of Session - 06/29/23 0759     Visit Number 4    Number of Visits 16    Date for PT Re-Evaluation 08/12/23    Authorization Type [employer]    PT Start Time 0802    PT Stop Time 0838    PT Time Calculation (min) 36 min    Activity Tolerance Patient tolerated treatment well    Behavior During Therapy [facility] for tasks assessed/performed              Past Medical History:  Diagnosis Date   Allergy    Asthma    mild, worse as teenager   Eczema    Dr. Terri Piedra   H/O echocardiogram 2012   had chest pain/stress at that time, EKG abnormal; eval with echo and holter normal, cardiology, Dr. Allyson Sabal [facility]   Spermatocele of epididymis, single    right   Past Surgical History:  Procedure Laterality Date   APPENDECTOMY  10/13/1987   LACERATION REPAIR  10/13/1999   left forehead   OSTEOTOMY METACARPAL / FINGER Right    pinky   SKIN BIOPSY     Dr. Terri Piedra   Patient Active Problem List   Diagnosis Date Noted   Cervical spondylosis 06/21/2023   Degenerative disc disease, cervical 06/21/2023   Mixed hyperlipidemia 06/07/2023   Encounter to establish care 08/04/2021   Annual physical exam 07/31/2021   Fracture of metacarpal shaft of right hand, closed, initial encounter 08/23/2018   Chest tightness 07/28/2011    PCP: Dulce Sellar  REFERRING PROVIDER: Glenetta Hew  REFERRING DIAG: cervical radiculopathy   THERAPY DIAG:  Radiculopathy, cervical region  Rationale for Evaluation and Treatment: Rehabilitation  ONSET DATE: 3 weeks ago   SUBJECTIVE:                                                                                                                                                                                      SUBJECTIVE STATEMENT: Pt states much improved numbness in UE, very  minimal just in AM, and then has been going away. L side of neck still sore, pain is the same there.   Eval: Pt states new onset of L sided neck pain With numbness in L arm into all fingers. States more of a numbness sensation vs tingling. He has had pain for about 3 weeks, does feel that pain is better than it was last week. He was given muscle relaxers and took for a few days, but is no  longer taking. He has had some muscle tension/pain in neck previously, but not to this degree, and not with pain into UE. 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 They do have a fitness center at work that he uses to work out daily. Has been using heating pad.   Hand dominance: Left, but uses  R for most gross motor activities.   PERTINENT HISTORY: none   PAIN:  Are you having pain? Yes: NPRS scale: 2-3/10  was up to 7/10 2 weeks ago.  Pain location: L side of neck, numbness into L UE  Pain description: sore, painful and limited L rotation.  Aggravating factors: cervical flexion, L rotation Relieving factors: none stated   PRECAUTIONS: None  RED FLAGS: None   WEIGHT BEARING RESTRICTIONS: No  FALLS:  Has patient fallen in last 6 months? No   PLOF: Independent  PATIENT GOALS: Decreased pain  NEXT MD VISIT:   OBJECTIVE:   DIAGNOSTIC FINDINGS:    PATIENT SURVEYS :  FOTO   COGNITION: Overall cognitive status: Within functional limits for tasks assessed     SENSATION: WFL  POSTURE: wfl   UPPER EXTREMITY ROM:  Shoulders: WFL Neck:   flexion: mild limitation/ pain  Ext: wfl  - no pian  L rotation- 65 deg- pain  R rotation- 70 deg, tightness     UPPER EXTREMITY MMT:  MMT Right eval Left eval  Shoulder flexion 4 5  Shoulder extension    Shoulder abduction 4 5  Shoulder adduction    Shoulder internal rotation 5   Shoulder external rotation 5   Middle trapezius    Lower trapezius    Elbow flexion    Elbow extension    Wrist flexion    Wrist extension    Wrist ulnar  deviation    Wrist radial deviation    Wrist pronation    Wrist supination    Grip strength (lbs)    (Blank rows = not tested)   PALPATION:  Pain at L mid c-spine with palpation,  Tightness in L cervical paraspinals and UT, into L levator and rhomboid.  Supine: L rotation PROM, mild limitation with Pain at end range.     TODAY'S TREATMENT:                                                                                                                                         DATE:  06/29/2023 Therapeutic Exercise: Aerobic: Supine:  Seated:  cervical rotation x 5 bil with overpressure to L ; chin tucks x 10;  chin tuck with extension x 10;  Standing:   Rows Blue TB x 20;  Stretches:  L UT stretch 15 sec x 3 ;  Neuromuscular Re-education: Manual Therapy:   STM to Bil cervical paraspinals and L UT,  light PA mobs for mid c-spine; side glides;   manual distraction 10 sec x  10;   PROm for L rotation  ; Modalities:    Previous:  Therapeutic Exercise: Aerobic: Supine:  Seated:  cervical rotation x 5 bil with overpressure to L;  shoulder rolls x 10;   Standing:  Stretches:  L UT stretch 15 sec x 3 ;  pec stretch with nerve glide 2 x 10;  Neuromuscular Re-education: Manual Therapy:   STM to Bil cervical paraspinals and L UT,  light PA mobs for mid c-spine;   manual distraction 10 sec x 10;  PROm for L rotation  Therapeutic Activity: Self Care:     Therapeutic Exercise: Aerobic: Supine:  cervical rotation x 5 bil;  Seated:  cervical rotation x 5 bil;  ext 2 x 5  Standing: Stretches:  L UT stretch 15 sec x 3 ;   Neuromuscular Re-education: Manual Therapy:   STM to Bil cervical paraspinals and L UT,  light PA mobs for mid c-spine;  Light manual distraction 10 sec x 10;  Therapeutic Activity: Self Care: reviewed seated posture for work, and recommendation to limit flexion and L SB if painful.   PATIENT EDUCATION:  Education details: reviewed HEP and home management  Person  educated: Patient Education method: Explanation, Demonstration, Tactile cues, Verbal cues, and Handouts Education comprehension: verbalized understanding, returned demonstration, verbal cues required, tactile cues required, and needs further education   HOME EXERCISE PROGRAM: Access Code: [email or web address] URL: [email or web address] Date: 06/17/2023 Prepared by: Sedalia Muta  Exercises - Seated Cervical Sidebending Stretch  - 2 x daily - 3 reps - 15 hold - Supine Cervical Rotation AROM on Pillow  - 2 x daily - 1 sets - 5-10 reps - 3 sec hold - Seated Cervical Rotation AROM  - 1 x daily - 1 sets - 10 reps - 3 sec  hold  ASSESSMENT:  CLINICAL IMPRESSION: 06/29/2023 Continued focus on manual for muscle tension relief and L sided pain. Continues to have pinch at end range of L rotation. UE pain/numbness much improved. Plan to progress as tolerated.   Eval:  Patient presents with primary complaint of pain in L side of neck and numbness into L UE, symptoms consistent with cervical radiculopathy. Pts symptoms already improving over the last week. He has mostly constant numbness sensation in UE and into fingers. He has most pain in central/L cervical region, with cervical ROM. He also has increased muscle tension in L cervical musculature. Pt with decreased ability for  full functional activities, reaching, lifting, work duties and  IADLs. Pt to benefit from skilled PT to improve deficits and pain.    OBJECTIVE IMPAIRMENTS: decreased activity tolerance, decreased mobility, decreased ROM, decreased strength, increased muscle spasms, impaired flexibility, impaired UE functional use, and pain.   ACTIVITY LIMITATIONS: carrying, lifting, reach over head, and locomotion level  PARTICIPATION LIMITATIONS: meal prep, cleaning, laundry, driving, community activity, and yard work  PERSONAL FACTORS:  none   are also affecting patient's functional outcome.   REHAB POTENTIAL: Good  CLINICAL  DECISION MAKING: Stable/uncomplicated  EVALUATION COMPLEXITY: Low  GOALS: Goals reviewed with patient? Yes   SHORT TERM GOALS: Target date: 07/08/2023    Pt to be intendment with initial HEP  Goal status: INITIAL  2.  Pt to report numbness in L UE to be intermittent, less than 50 % of the time.   Goal status: INITIAL    LONG TERM GOALS: Target date: 08/12/2023    Pt to be independent with final HEP  Goal status: INITIAL  2.  Pt to demo  improved ROM of neck to be [facility] and pain free, to improve ability for ADLs.   Goal status: INITIAL  3.  Pt to report pain in neck and UE to be 0-2/10 with activity .   Goal status: INITIAL  4.  Pt to report ability for job duties and exercise to be at least 75 % improved overall.   Goal status: INITIAL    PLAN: PT FREQUENCY: 1-2x/week  PT DURATION: 8 weeks  PLANNED INTERVENTIONS: Therapeutic exercises, Therapeutic activity, Neuromuscular re-education, Patient/Family education, Self Care, Joint mobilization, Joint manipulation, Stair training, DME instructions, Aquatic Therapy, Dry Needling, Electrical stimulation, Cryotherapy, Moist heat, Taping, Ultrasound, Ionotophoresis 4mg /ml Dexamethasone, Manual therapy,  Vasopneumatic device, Traction, Spinal manipulation, Spinal mobilization,    PLAN FOR NEXT SESSION:  manual for L UT and neck muscle tension, manual traction, dry needling as needed, posture education and strengthening.  Improve L rotation and pain.   Sedalia Muta, PT, DPT 8:41 AM  06/29/23

## 2023-07-07 ENCOUNTER — Encounter: Payer: Self-pay | Admitting: Physical Therapy

## 2023-07-07 ENCOUNTER — Ambulatory Visit: Payer: 59 | Admitting: Physical Therapy

## 2023-07-07 DIAGNOSIS — M5412 Radiculopathy, cervical region: Secondary | ICD-10-CM | POA: Diagnosis not present

## 2023-07-07 NOTE — Therapy (Signed)
OUTPATIENT PHYSICAL THERAPY UPPER EXTREMITY TREATMENT     Patient Name: Oscar Ellis MRN: [id number] DOB:06/30/1971, 52 y.o., male Today's Date: 07/07/2023  END OF SESSION:  PT End of Session - 07/07/23 0756     Visit Number 5    Number of Visits 16    Date for PT Re-Evaluation 08/12/23    Authorization Type U[employer]    PT Start Time 0800    PT Stop Time 0845    PT Time Calculation (min) 45 min    Activity Tolerance Patient tolerated treatment well    Behavior During Therapy [facility] for tasks assessed/performed              Past Medical History:  Diagnosis Date   Allergy    Asthma    mild, worse as teenager   Eczema    Dr. Terri Piedra   H/O echocardiogram 2012   had chest pain/stress at that time, EKG abnormal; eval with echo and holter normal, cardiology, Dr. Allyson Sabal [facility]   Spermatocele of epididymis, single    right   Past Surgical History:  Procedure Laterality Date   APPENDECTOMY  10/13/1987   LACERATION REPAIR  10/13/1999   left forehead   OSTEOTOMY METACARPAL / FINGER Right    pinky   SKIN BIOPSY     Dr. Terri Piedra   Patient Active Problem List   Diagnosis Date Noted   Cervical spondylosis 06/21/2023   Degenerative disc disease, cervical 06/21/2023   Mixed hyperlipidemia 06/07/2023   Encounter to establish care 08/04/2021   Annual physical exam 07/31/2021   Fracture of metacarpal shaft of right hand, closed, initial encounter 08/23/2018   Chest tightness 07/28/2011    PCP: Dulce Sellar  REFERRING PROVIDER: Glenetta Hew  REFERRING DIAG: cervical radiculopathy   THERAPY DIAG:  Radiculopathy, cervical region  Rationale for Evaluation and Treatment: Rehabilitation  ONSET DATE: 3 weeks ago   SUBJECTIVE:                                                                                                                                                                                      SUBJECTIVE STATEMENT:  07/07/2023  Pt notes Increased pain wed,  thurs, fri, with pain down the arm, but has been better since then. No UE pain, and just mild soreness in L side of neck.   Eval: Pt states new onset of L sided neck pain With numbness in L arm into all fingers. States more of a numbness sensation vs tingling. He has had pain for about 3 weeks, does feel that pain is better than it was last week. He was given muscle relaxers and took for a few  days, but is no longer taking. He has had some muscle tension/pain in neck previously, but not to this degree, and not with pain into UE. 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 They do have a fitness center at work that he uses to work out daily. Has been using heating pad.   Hand dominance: Left, but uses  R for most gross motor activities.   PERTINENT HISTORY: none   PAIN:  Are you having pain? Yes: NPRS scale: 2-3/10   Pain location: L side of neck,  Pain description: sore, painful and limited L rotation.  Aggravating factors: cervical flexion, L rotation Relieving factors: none stated   PRECAUTIONS: None  RED FLAGS: None   WEIGHT BEARING RESTRICTIONS: No  FALLS:  Has patient fallen in last 6 months? No   PLOF: Independent  PATIENT GOALS: Decreased pain  NEXT MD VISIT:   OBJECTIVE:   DIAGNOSTIC FINDINGS:    PATIENT SURVEYS :    COGNITION: Overall cognitive status: Within functional limits for tasks assessed     SENSATION: WFL  POSTURE: wfl   UPPER EXTREMITY ROM:  Shoulders: WFL Neck:   flexion: mild limitation/ pain  Ext: wfl  - no pian  L rotation- 65 deg- pain  R rotation- 70 deg, tightness     UPPER EXTREMITY MMT:  MMT Right eval Left eval  Shoulder flexion 4 5  Shoulder extension    Shoulder abduction 4 5  Shoulder adduction    Shoulder internal rotation 5   Shoulder external rotation 5   Middle trapezius    Lower trapezius    Elbow flexion    Elbow extension    Wrist flexion    Wrist extension    Wrist ulnar deviation    Wrist radial  deviation    Wrist pronation    Wrist supination    Grip strength (lbs)    (Blank rows = not tested)   PALPATION:  Pain at L mid c-spine with palpation,  Tightness in L cervical paraspinals and UT, into L levator and rhomboid.  Supine: L rotation PROM, mild limitation with Pain at end range.     TODAY'S TREATMENT:                                                                                                                                         DATE:  07/07/2023 Therapeutic Exercise: Aerobic: Supine:  Seated:  cervical rotation x 5 bil with overpressure to L ; chin tucks x 10;  chin tuck with extension x 10;  Standing:    Stretches:  L UT stretch 15 sec x 3 ;  Neuromuscular Re-education: Manual Therapy:   STM to Bil cervical paraspinals and L UT,  light PA mobs for mid c-spine; side glides;   manual distraction 10 sec x 10;   PROm for L rotation  ; Modalities: moist heat pack  at end of session, x 10 min seated, to cervical and L UT.  Trigger Point Dry-Needling  Treatment instructions: Expect mild to moderate muscle soreness. S/S of pneumothorax if dry needled over a lung field, and to seek immediate medical attention should they occur. Patient verbalized understanding of these instructions and education.  Patient Consent Given: Yes Education handout provided: Previously provided Muscles treated: L UT, L cervical multifidi Electrical stimulation performed: No Parameters: N/A Treatment response/outcome: UT: Twitch response,  Multifidi and UT: palpable increase in muscle length.     Therapeutic Exercise: Aerobic: Supine:  Seated:  cervical rotation x 5 bil with overpressure to L ; chin tucks x 10;  chin tuck with extension x 10;  Standing:   Rows Blue TB x 20;  Stretches:  L UT stretch 15 sec x 3 ;  Neuromuscular Re-education: Manual Therapy:   STM to Bil cervical paraspinals and L UT,  light PA mobs for mid c-spine; side glides;   manual distraction 10 sec x 10;   PROm  for L rotation  ; Modalities:    Previous:  Therapeutic Exercise: Aerobic: Supine:  Seated:  cervical rotation x 5 bil with overpressure to L;  shoulder rolls x 10;   Standing:  Stretches:  L UT stretch 15 sec x 3 ;  pec stretch with nerve glide 2 x 10;  Neuromuscular Re-education: Manual Therapy:   STM to Bil cervical paraspinals and L UT,  light PA mobs for mid c-spine;   manual distraction 10 sec x 10;  PROm for L rotation  Therapeutic Activity: Self Care:   Therapeutic Exercise: Aerobic: Supine:  cervical rotation x 5 bil;  Seated:  cervical rotation x 5 bil;  ext 2 x 5  Standing: Stretches:  L UT stretch 15 sec x 3 ;   Neuromuscular Re-education: Manual Therapy:   STM to Bil cervical paraspinals and L UT,  light PA mobs for mid c-spine;  Light manual distraction 10 sec x 10;  Therapeutic Activity: Self Care: reviewed seated posture for work, and recommendation to limit flexion and L SB if painful.   PATIENT EDUCATION:  Education details: reviewed HEP and home management  Person educated: Patient Education method: Explanation, Demonstration, Tactile cues, Verbal cues, and Handouts Education comprehension: verbalized understanding, returned demonstration, verbal cues required, tactile cues required, and needs further education   HOME EXERCISE PROGRAM: Access Code: [email or web address] URL: [email or web address] Date: 06/17/2023 Prepared by: Sedalia Muta  Exercises - Seated Cervical Sidebending Stretch  - 2 x daily - 3 reps - 15 hold - Supine Cervical Rotation AROM on Pillow  - 2 x daily - 1 sets - 5-10 reps - 3 sec hold - Seated Cervical Rotation AROM  - 1 x daily - 1 sets - 10 reps - 3 sec  hold  ASSESSMENT:  CLINICAL IMPRESSION: 07/07/2023  Continued focus on manual for muscle tension relief and L sided pain. Continues to have pinch at end range of L rotation only in seated/standing position, not in supine position.  Continued dry needling today due to  previous + response. Reviewed frequent cervical extension during the day. Pt to benefit from continued care   Eval:  Patient presents with primary complaint of pain in L side of neck and numbness into L UE, symptoms consistent with cervical radiculopathy. Pts symptoms already improving over the last week. He has mostly constant numbness sensation in UE and into fingers. He has most pain in central/L cervical region, with cervical ROM. He also has  increased muscle tension in L cervical musculature. Pt with decreased ability for  full functional activities, reaching, lifting, work duties and  IADLs. Pt to benefit from skilled PT to improve deficits and pain.    OBJECTIVE IMPAIRMENTS: decreased activity tolerance, decreased mobility, decreased ROM, decreased strength, increased muscle spasms, impaired flexibility, impaired UE functional use, and pain.   ACTIVITY LIMITATIONS: carrying, lifting, reach over head, and locomotion level  PARTICIPATION LIMITATIONS: meal prep, cleaning, laundry, driving, community activity, and yard work  PERSONAL FACTORS:  none   are also affecting patient's functional outcome.   REHAB POTENTIAL: Good  CLINICAL DECISION MAKING: Stable/uncomplicated  EVALUATION COMPLEXITY: Low  GOALS: Goals reviewed with patient? Yes   SHORT TERM GOALS: Target date: 07/08/2023    Pt to be intendment with initial HEP  Goal status: INITIAL  2.  Pt to report numbness in L UE to be intermittent, less than 50 % of the time.   Goal status: INITIAL    LONG TERM GOALS: Target date: 08/12/2023   Pt to be independent with final HEP  Goal status: INITIAL  2.  Pt to demo improved ROM of neck to be [facility] and pain free, to improve ability for ADLs.   Goal status: INITIAL  3.  Pt to report pain in neck and UE to be 0-2/10 with activity .   Goal status: INITIAL  4.  Pt to report ability for job duties and exercise to be at least 75 % improved overall.   Goal status:  INITIAL    PLAN: PT FREQUENCY: 1-2x/week  PT DURATION: 8 weeks  PLANNED INTERVENTIONS: Therapeutic exercises, Therapeutic activity, Neuromuscular re-education, Patient/Family education, Self Care, Joint mobilization, Joint manipulation, Stair training, DME instructions, Aquatic Therapy, Dry Needling, Electrical stimulation, Cryotherapy, Moist heat, Taping, Ultrasound, Ionotophoresis 4mg /ml Dexamethasone, Manual therapy,  Vasopneumatic device, Traction, Spinal manipulation, Spinal mobilization,    PLAN FOR NEXT SESSION:  manual for L UT and neck muscle tension, manual traction, dry needling as needed, posture education and strengthening.  Improve L rotation and pain.   Sedalia Muta, PT, DPT 8:39 AM  07/07/23

## 2023-07-14 ENCOUNTER — Encounter: Payer: Self-pay | Admitting: Physical Therapy

## 2023-07-14 ENCOUNTER — Ambulatory Visit: Payer: 59 | Admitting: Physical Therapy

## 2023-07-14 DIAGNOSIS — M5412 Radiculopathy, cervical region: Secondary | ICD-10-CM | POA: Diagnosis not present

## 2023-07-14 NOTE — Therapy (Addendum)
 OUTPATIENT PHYSICAL THERAPY UPPER EXTREMITY TREATMENT     Patient Name: Oscar Ellis MRN: [id number] DOB:1971/01/13, 52 y.o., male Today's Date: 07/14/2023  END OF SESSION:  PT End of Session - 07/14/23 0804     Visit Number 6    Number of Visits 16    Date for PT Re-Evaluation 08/12/23    Authorization Type [employer]    PT Start Time 0808    PT Stop Time 0843    PT Time Calculation (min) 35 min    Activity Tolerance Patient tolerated treatment well    Behavior During Therapy [facility] for tasks assessed/performed              Past Medical History:  Diagnosis Date   Allergy    Asthma    mild, worse as teenager   Eczema    Dr. Fleurette Humbles   H/O echocardiogram 2012   had chest pain/stress at that time, EKG abnormal; eval with echo and holter normal, cardiology, Dr. [facility]   Spermatocele of epididymis, single    right   Past Surgical History:  Procedure Laterality Date   APPENDECTOMY  10/13/1987   LACERATION REPAIR  10/13/1999   left forehead   OSTEOTOMY METACARPAL / FINGER Right    pinky   SKIN BIOPSY     Dr. Fleurette Humbles   Patient Active Problem List   Diagnosis Date Noted   Cervical spondylosis 06/21/2023   Degenerative disc disease, cervical 06/21/2023   Mixed hyperlipidemia 06/07/2023   Encounter to establish care 08/04/2021   Annual physical exam 07/31/2021   Fracture of metacarpal shaft of right hand, closed, initial encounter 08/23/2018   Chest tightness 07/28/2011    PCP: Versa Gore  REFERRING PROVIDER: Scherrie Curt  REFERRING DIAG: cervical radiculopathy   THERAPY DIAG:  Radiculopathy, cervical region  Rationale for Evaluation and Treatment: Rehabilitation  ONSET DATE: 3 weeks ago   SUBJECTIVE:                                                                                                                                                                                      SUBJECTIVE STATEMENT:  07/14/2023  Pt notes minimal pain in neck.  Some soreness 1/10 , up to 3/10 at times. Overall much improved, and no tingling/UE symptoms. States arm tingling "once in a while". He is going away a couple times in October, unsure if he will be able to attend PT.   Eval: Pt states new onset of L sided neck pain With numbness in L arm into all fingers. States more of a numbness sensation vs tingling. He has had pain for about 3 weeks, does feel that pain  is better than it was last week. He was given muscle relaxers and took for a few days, but is no longer taking. He has had some muscle tension/pain in neck previously, but not to this degree, and not with pain into UE. 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 They do have a fitness center at work that he uses to work out daily. Has been using heating pad.   Hand dominance: Left, but uses  R for most gross motor activities.   PERTINENT HISTORY: none   PAIN:  Are you having pain? Yes: NPRS scale: 1-3/10   Pain location: L side of neck,  Pain description: sore, painful and limited L rotation.  Aggravating factors: cervical flexion, L rotation Relieving factors: none stated   PRECAUTIONS: None  RED FLAGS: None   WEIGHT BEARING RESTRICTIONS: No  FALLS:  Has patient fallen in last 6 months? No   PLOF: Independent  PATIENT GOALS: Decreased pain  NEXT MD VISIT:   OBJECTIVE: updated 07/14/23.   DIAGNOSTIC FINDINGS:    PATIENT SURVEYS :    COGNITION: Overall cognitive status: Within functional limits for tasks assessed     SENSATION: WFL  POSTURE: wfl   UPPER EXTREMITY ROM:   Shoulders: WFL Neck:   flexion: mild limitation/ pain  Ext: wfl  - no pian  L rotation- 70 deg- very mild pain on L at end range  R rotation- 70 deg, tightness     UPPER EXTREMITY MMT:  MMT Right eval Left eval R 07/14/23  Shoulder flexion 4 5 5   Shoulder extension     Shoulder abduction 4 5 5   Shoulder adduction     Shoulder internal rotation 5  5  Shoulder external rotation 5   5  Middle trapezius     Lower trapezius     Elbow flexion     Elbow extension     Wrist flexion     Wrist extension     Wrist ulnar deviation     Wrist radial deviation     Wrist pronation     Wrist supination     Grip strength (lbs)     (Blank rows = not tested)   PALPATION:  No pain to palpate today     TODAY'S TREATMENT:                                                                                                                                         DATE:  07/14/2023 Therapeutic Exercise: Aerobic: Supine:  Cervical rotation to the L.  Seated:  cervical rotation x 5 bil with overpressure to L ; chin tucks x 10;  chin tuck with extension x 10;  Standing:    Stretches:   Neuromuscular Re-education: Manual Therapy:   STM to Bil cervical paraspinals and L UT,  PA mobs for mid c-spine; side glides;   manual distraction  10 sec x 10;   PROm for L rotation; Modalities:    Previous:   Therapeutic Exercise: Aerobic: Supine:  Seated:  cervical rotation x 5 bil with overpressure to L ; chin tucks x 10;  chin tuck with extension x 10;  Standing:    Stretches:  L UT stretch 15 sec x 3 ;  Neuromuscular Re-education: Manual Therapy:   STM to Bil cervical paraspinals and L UT,  light PA mobs for mid c-spine; side glides;   manual distraction 10 sec x 10;   PROm for L rotation  ; Modalities: moist heat pack at end of session, x 10 min seated, to cervical and L UT.  Trigger Point Dry-Needling  Treatment instructions: Expect mild to moderate muscle soreness. S/S of pneumothorax if dry needled over a lung field, and to seek immediate medical attention should they occur. Patient verbalized understanding of these instructions and education.  Patient Consent Given: Yes Education handout provided: Previously provided Muscles treated: L UT, L cervical multifidi Electrical stimulation performed: No Parameters: N/A Treatment response/outcome: UT: Twitch response,  Multifidi and UT:  palpable increase in muscle length.     Therapeutic Exercise: Aerobic: Supine:  Seated:  cervical rotation x 5 bil with overpressure to L ; chin tucks x 10;  chin tuck with extension x 10;  Standing:   Rows Blue TB x 20;  Stretches:  L UT stretch 15 sec x 3 ;  Neuromuscular Re-education: Manual Therapy:   STM to Bil cervical paraspinals and L UT,  light PA mobs for mid c-spine; side glides;   manual distraction 10 sec x 10;   PROm for L rotation  ; Modalities:    Previous:  Therapeutic Exercise: Aerobic: Supine:  Seated:  cervical rotation x 5 bil with overpressure to L;  shoulder rolls x 10;   Standing:  Stretches:  L UT stretch 15 sec x 3 ;  pec stretch with nerve glide 2 x 10;  Neuromuscular Re-education: Manual Therapy:   STM to Bil cervical paraspinals and L UT,  light PA mobs for mid c-spine;   manual distraction 10 sec x 10;  PROm for L rotation  Therapeutic Activity: Self Care:     PATIENT EDUCATION:  Education details: reviewed HEP and home management , UE strengthening , referral to sports med if needed. Person educated: Patient Education method: Explanation, Demonstration, Tactile cues, Verbal cues, and Handouts Education comprehension: verbalized understanding, returned demonstration, verbal cues required, tactile cues required, and needs further education   HOME EXERCISE PROGRAM: Access Code: [email or web address] URL: [email or web address] Date: 06/17/2023 Prepared by: Terrilee Few  Exercises - Seated Cervical Sidebending Stretch  - 2 x daily - 3 reps - 15 hold - Supine Cervical Rotation AROM on Pillow  - 2 x daily - 1 sets - 5-10 reps - 3 sec hold - Seated Cervical Rotation AROM  - 1 x daily - 1 sets - 10 reps - 3 sec  hold  ASSESSMENT:  CLINICAL IMPRESSION: 07/14/2023  Continued focus on manual for muscle tension relief and L sided pain. He has much improved pain overall, with very low level soreness with end range motion to the L. He has good  tolerance for manual. Reviewed HEP and UE strengthening to do at this time. Pt doing well with home management, but we did discuss need to f/u with MD in future or see sports med, if pain does not continue to resolve. He will be out of town for a couple weeks this  month, and is unsure if he will be able to attend PT very much. He will call next week if he has increased pain or problems. Will hold at this time.   Eval:  Patient presents with primary complaint of pain in L side of neck and numbness into L UE, symptoms consistent with cervical radiculopathy. Pts symptoms already improving over the last week. He has mostly constant numbness sensation in UE and into fingers. He has most pain in central/L cervical region, with cervical ROM. He also has increased muscle tension in L cervical musculature. Pt with decreased ability for  full functional activities, reaching, lifting, work duties and  IADLs. Pt to benefit from skilled PT to improve deficits and pain.    OBJECTIVE IMPAIRMENTS: decreased activity tolerance, decreased mobility, decreased ROM, decreased strength, increased muscle spasms, impaired flexibility, impaired UE functional use, and pain.   ACTIVITY LIMITATIONS: carrying, lifting, reach over head, and locomotion level  PARTICIPATION LIMITATIONS: meal prep, cleaning, laundry, driving, community activity, and yard work  PERSONAL FACTORS:  none   are also affecting patient's functional outcome.   REHAB POTENTIAL: Good  CLINICAL DECISION MAKING: Stable/uncomplicated  EVALUATION COMPLEXITY: Low  GOALS: Goals reviewed with patient? Yes   SHORT TERM GOALS: Target date: 07/08/2023    Pt to be intendment with initial HEP  Goal status: MET  2.  Pt to report numbness in L UE to be intermittent, less than 50 % of the time.   Goal status: MET    LONG TERM GOALS: Target date: 08/12/2023   Pt to be independent with final HEP  Goal status: MET  2.  Pt to demo improved ROM of  neck to be [facility] and pain free, to improve ability for ADLs.   Goal status: partially MET  3.  Pt to report pain in neck and UE to be 0-2/10 with activity .   Goal status: MET  4.  Pt to report ability for job duties and exercise to be at least 75 % improved overall.   Goal status: MET    PLAN: PT FREQUENCY: 1-2x/week  PT DURATION: 8 weeks  PLANNED INTERVENTIONS: Therapeutic exercises, Therapeutic activity, Neuromuscular re-education, Patient/Family education, Self Care, Joint mobilization, Joint manipulation, Stair training, DME instructions, Aquatic Therapy, Dry Needling, Electrical stimulation, Cryotherapy, Moist heat, Taping, Ultrasound, Ionotophoresis 4mg /ml Dexamethasone, Manual therapy,  Vasopneumatic device, Traction, Spinal manipulation, Spinal mobilization,    PLAN FOR NEXT SESSION:  manual for L UT and neck muscle tension, manual traction, dry needling as needed, posture education and strengthening.  Improve L rotation and pain.   Terrilee Few, PT, DPT 8:55 AM  07/14/23   PHYSICAL THERAPY DISCHARGE SUMMARY  Visits from Start of Care: 6   Plan: Patient agrees to discharge.  Patient goals were  met. Patient is being discharged due to meeting the stated rehab goals.     Terrilee Few, PT, DPT 3:55 PM  02/10/24

## 2024-02-03 ENCOUNTER — Ambulatory Visit: Admitting: Family

## 2024-02-03 ENCOUNTER — Encounter: Payer: Self-pay | Admitting: Family

## 2024-02-03 VITALS — BP 112/71 | HR 53 | Temp 97.4°F | Ht 73.0 in | Wt 187.4 lb

## 2024-02-03 DIAGNOSIS — R110 Nausea: Secondary | ICD-10-CM | POA: Diagnosis not present

## 2024-02-03 DIAGNOSIS — R0789 Other chest pain: Secondary | ICD-10-CM | POA: Diagnosis not present

## 2024-02-03 NOTE — Progress Notes (Signed)
 Patient ID: Oscar Ellis, male    DOB: 08-04-1971, 53 y.o.   MRN: [id number]  Chief Complaint  Patient presents with   Nausea    Pt c/o nausea and pain in sternum area pain for 2 weeks.   Discussed the use of AI scribe software for clinical note transcription with the patient, who gave verbal consent to proceed.  History of Present Illness The patient presents with new onset pain in the xiphoid process of the sternum, described as a constant dull ache that intensifies to a sharp pain upon palpation. The pain does not interfere with daily activities, including exercise, and is not exacerbated by deep breathing. The patient also reports a persistent, non-debilitating nausea that does not affect his appetite. He denies any recent trauma, changes in exercise routine, or other symptoms such as bloating or increased gas. He occasionally experiences heartburn, attributed to dietary choices, and manages it with over-the-counter Tums as needed. The patient has not noticed any correlation between the heartburn and the current symptoms. He has not recently lost significant weight but a few pounds after giving up sodas for lent.  Assessment & Plan Xiphoid tenderness - Sharp pain at xiphoid process with palpation only, does not hurt continuously, likely superficial. Weight loss may increase xiphoid sensitivity. No tenderness with epigastric palpation. - Order ultrasound of xiphoid process. - Start famotidine twice daily for one week. - Advise reducing acidic foods and beverages. - Schedule follow-up if symptoms persist or worsen.  Nausea - Persistent nausea without vomiting or appetite loss. Possible indigestion or acid-related irritation. Occasional heartburn. - Try OTC generic Pepcid twice a day for 1 week. - Avoid acidic foods, limit caffeine intake, reduce stress as able. - Call office if sx are persisting after 1 week pepcid trial.       Subjective:    Outpatient Medications Prior to Visit   Medication Sig Dispense Refill   Loratadine 10 MG CAPS      Multiple Vitamins-Minerals (MULTIVITAMIN WITH MINERALS) tablet Take 1 tablet by mouth daily.     celecoxib  (CELEBREX ) 200 MG capsule Take 1 capsule (200 mg total) by mouth 2 (two) times daily. (Patient not taking: Reported on 02/03/2024) 30 capsule 0   cyclobenzaprine  (FLEXERIL ) 10 MG tablet Take 1 tablet (10 mg total) by mouth 3 (three) times daily as needed for muscle spasms. (Patient not taking: Reported on 02/03/2024) 30 tablet 0   gabapentin  (NEURONTIN ) 300 MG capsule Take 1 capsule (300 mg total) by mouth 3 (three) times daily. (Patient not taking: Reported on 02/03/2024) 90 capsule 3   No facility-administered medications prior to visit.   Past Medical History:  Diagnosis Date   Allergy    Asthma    mild, worse as teenager   Eczema    Dr. Fleurette Humbles   H/O echocardiogram 2012   had chest pain/stress at that time, EKG abnormal; eval with echo and holter normal, cardiology, Dr. Katheryne [facility]   Spermatocele of epididymis, single    right   Past Surgical History:  Procedure Laterality Date   APPENDECTOMY  10/13/1987   LACERATION REPAIR  10/13/1999   left forehead   OSTEOTOMY METACARPAL / FINGER Right    pinky   SKIN BIOPSY     Dr. Fleurette Humbles   Allergies  Allergen Reactions   Neosporin [Neomycin-Bacitracin Zn-Polymyx]     rash      Objective:    Physical Exam Vitals and nursing note reviewed.  Constitutional:      General: He  is not in acute distress.    Appearance: Normal appearance.  HENT:     Head: Normocephalic.  Cardiovascular:     Rate and Rhythm: Normal rate and regular rhythm.  Pulmonary:     Effort: Pulmonary effort is normal.     Breath sounds: Normal breath sounds.  Chest:     Chest wall: Swelling (very mild at xiphoid process) and tenderness (at the xiphoid process) present. No mass, deformity or crepitus.  Abdominal:     General: Abdomen is flat.     Palpations: Abdomen is soft.     Tenderness: There  is no abdominal tenderness.  Musculoskeletal:        General: Normal range of motion.     Cervical back: Normal range of motion.  Skin:    General: Skin is warm and dry.  Neurological:     Mental Status: He is alert and oriented to person, place, and time.  Psychiatric:        Mood and Affect: Mood normal.    BP 112/71 (BP Location: Left Arm, Patient Position: Sitting, Cuff Size: Large)   Pulse (!) 53   Temp (!) 97.4 F (36.3 C) (Temporal)   Ht 6\' 1"  (1.854 m)   Wt 187 lb 6.4 oz (85 kg)   SpO2 97%   BMI 24.72 kg/m  Wt Readings from Last 3 Encounters:  02/03/24 187 lb 6.4 oz (85 kg)  06/11/23 188 lb 6.4 oz (85.5 kg)  06/07/23 188 lb 12.8 oz (85.6 kg)      Versa Gore, NP

## 2024-02-09 ENCOUNTER — Ambulatory Visit
Admission: RE | Admit: 2024-02-09 | Discharge: 2024-02-09 | Disposition: A | Discharge status: Home / Self Care | Source: Ambulatory Visit | Attending: Family | Admitting: Family

## 2024-02-09 DIAGNOSIS — R0789 Other chest pain: Secondary | ICD-10-CM

## 2024-02-18 ENCOUNTER — Encounter: Payer: Self-pay | Admitting: Family

## 2024-04-05 ENCOUNTER — Encounter: Admitting: Family

## 2024-04-05 ENCOUNTER — Encounter: Payer: Self-pay | Admitting: Family

## 2024-04-05 ENCOUNTER — Ambulatory Visit (INDEPENDENT_AMBULATORY_CARE_PROVIDER_SITE_OTHER): Admitting: Family

## 2024-04-05 VITALS — BP 100/70 | HR 52 | Temp 98.2°F | Ht 70.0 in | Wt 182.6 lb

## 2024-04-05 DIAGNOSIS — Z0000 Encounter for general adult medical examination without abnormal findings: Secondary | ICD-10-CM

## 2024-04-05 DIAGNOSIS — E782 Mixed hyperlipidemia: Secondary | ICD-10-CM

## 2024-04-05 DIAGNOSIS — Z23 Encounter for immunization: Secondary | ICD-10-CM

## 2024-04-05 DIAGNOSIS — R6882 Decreased libido: Secondary | ICD-10-CM | POA: Diagnosis not present

## 2024-04-05 DIAGNOSIS — R001 Bradycardia, unspecified: Secondary | ICD-10-CM | POA: Insufficient documentation

## 2024-04-05 LAB — COMPREHENSIVE METABOLIC PANEL WITH GFR
ALT: 40 U/L (ref 0–53)
AST: 25 U/L (ref 0–37)
Albumin: 4.6 g/dL (ref 3.5–5.2)
Alkaline Phosphatase: 95 U/L (ref 39–117)
BUN: 12 mg/dL (ref 6–23)
CO2: 29 meq/L (ref 19–32)
Calcium: 9.5 mg/dL (ref 8.4–10.5)
Chloride: 99 meq/L (ref 96–112)
Creatinine, Ser: 0.91 mg/dL (ref 0.40–1.50)
GFR: 97.93 mL/min (ref >60.00)
Glucose, Bld: 88 mg/dL (ref 70–99)
Potassium: 4.1 meq/L (ref 3.5–5.1)
Sodium: 138 meq/L (ref 135–145)
Total Bilirubin: 2.5 mg/dL — ABNORMAL HIGH (ref 0.2–1.2)
Total Protein: 7.1 g/dL (ref 6.0–8.3)

## 2024-04-05 LAB — CBC WITH DIFFERENTIAL/PLATELET
Basophils Absolute: 0 10*3/uL (ref 0.0–0.1)
Basophils Relative: 0.8 % (ref 0.0–3.0)
Eosinophils Absolute: 0.1 10*3/uL (ref 0.0–0.7)
Eosinophils Relative: 2 % (ref 0.0–5.0)
HCT: 44.5 % (ref 39.0–52.0)
Hemoglobin: 15.3 g/dL (ref 13.0–17.0)
Lymphocytes Relative: 27.2 % (ref 12.0–46.0)
Lymphs Abs: 1.7 10*3/uL (ref 0.7–4.0)
MCHC: 34.3 g/dL (ref 30.0–36.0)
MCV: 92.3 fl (ref 78.0–100.0)
Monocytes Absolute: 0.5 10*3/uL (ref 0.1–1.0)
Monocytes Relative: 8.6 % (ref 3.0–12.0)
Neutro Abs: 3.8 10*3/uL (ref 1.4–7.7)
Neutrophils Relative %: 61.4 % (ref 43.0–77.0)
Platelets: 215 10*3/uL (ref 150.0–400.0)
RBC: 4.82 Mil/uL (ref 4.22–5.81)
RDW: 12.8 % (ref 11.5–15.5)
WBC: 6.2 10*3/uL (ref 4.0–10.5)

## 2024-04-05 LAB — LIPID PANEL
Cholesterol: 243 mg/dL — ABNORMAL HIGH (ref 0–200)
HDL: 47.8 mg/dL (ref >39.00)
LDL Cholesterol: 168 mg/dL — ABNORMAL HIGH (ref 0–99)
NonHDL: 195.26
Total CHOL/HDL Ratio: 5
Triglycerides: 136 mg/dL (ref 0.0–149.0)
VLDL: 27.2 mg/dL (ref 0.0–40.0)

## 2024-04-05 LAB — TESTOSTERONE: Testosterone: 397.39 ng/dL (ref 300.00–890.00)

## 2024-04-05 NOTE — Patient Instructions (Signed)
 It was very nice to see you today!   I will review your lab results via MyChart in a few days.   Stay well! Enjoy your summer!    PLEASE NOTE:  If you had any lab tests please let us  know if you have not heard back within a few days. You may see your results on MyChart before we have a chance to review them but we will give you a call once they are reviewed by us . If we ordered any referrals today, please let us  know if you have not heard from their office within the next week.

## 2024-04-05 NOTE — Assessment & Plan Note (Signed)
 Has had for years, exercises most days, continues to report feeling asymptomatic. - Advised to notify office if any change in symptoms including fatigue, lethargy, dizziness. - Will continue to monitor

## 2024-04-05 NOTE — Progress Notes (Signed)
 Phone: [phone]  Subjective:  Patient 53 y.o. male presenting for annual physical.  Chief Complaint  Patient presents with   Annual Exam  Discussed the use of AI scribe software for clinical note transcription with the patient, who gave verbal consent to proceed.  History of Present Illness Oscar Ellis is a 53 year old male who presents for a routine follow-up visit.  His cholesterol levels were high during his last visit. He is working on improving his diet and engages in regular cardio exercise. He experiences achiness in his teeth, particularly on the inside part, but not deep in the canal. He has a history of cavities and many metal fillings. A recent dental visit revealed no new problems. He received the shingles vaccine last year, as both his parents and wife have had shingles.  See problem oriented charting- ROS- full  review of systems was completed and negative.   The following were reviewed and entered/updated in epic: Past Medical History:  Diagnosis Date   Allergy    Annual physical exam 07/31/2021   Asthma    mild, worse as teenager   Eczema    Dr. Ivin   Encounter to establish care 08/04/2021   H/O echocardiogram 10/12/2010   had chest pain/stress at that time, EKG abnormal; eval with echo and holter normal, cardiology, Dr. [facility]   Spermatocele of epididymis, single    right   Patient Active Problem List   Diagnosis Date Noted   Cervical spondylosis 06/21/2023   Degenerative disc disease, cervical 06/21/2023   Mixed hyperlipidemia 06/07/2023   Fracture of metacarpal shaft of right hand, closed, initial encounter 08/23/2018   Chest tightness 07/28/2011   Past Surgical History:  Procedure Laterality Date   APPENDECTOMY  10/13/1987   LACERATION REPAIR  10/13/1999   left forehead   OSTEOTOMY METACARPAL / FINGER Right    pinky   SKIN BIOPSY     Dr. Ivin    Family History  Problem Relation Age of Onset   Inflammatory bowel disease  Sister    Dementia Maternal Grandmother    COPD Maternal Grandfather        black lung   Heart disease Neg Hx    Stroke Neg Hx    Diabetes Neg Hx    Hypertension Neg Hx    Hyperlipidemia Neg Hx    Cancer Neg Hx    Colon cancer Neg Hx    Colon polyps Neg Hx    Esophageal cancer Neg Hx    Stomach cancer Neg Hx    Rectal cancer Neg Hx     Medications- reviewed and updated Current Outpatient Medications  Medication Sig Dispense Refill   Multiple Vitamins-Minerals (MULTIVITAMIN WITH MINERALS) tablet Take 1 tablet by mouth daily.     No current facility-administered medications for this visit.    Allergies-reviewed and updated Allergies  Allergen Reactions   Neosporin [Neomycin-Bacitracin Zn-Polymyx]     rash    Social History   Social History Narrative   Married, 1 child, son 44 years old with ADD, exercise - running, swimming, walking on the job, 3 sets of calisthenics.  Plays poker.  [employer].  Emergency planning/management officer.    Objective:  BP 100/70   Pulse (!) 52   Temp 98.2 F (36.8 C) (Temporal)   Ht 5' 10 (1.778 m)   Wt 182 lb 9.6 oz (82.8 kg)   SpO2 99%   BMI 26.20 kg/m  Physical Exam Vitals and nursing note reviewed.  Constitutional:  General: He is not in acute distress.    Appearance: Normal appearance.  HENT:     Head: Normocephalic.     Right Ear: Tympanic membrane and external ear normal.     Left Ear: Tympanic membrane and external ear normal.     Nose: Nose normal.     Mouth/Throat:     Mouth: Mucous membranes are moist.   Eyes:     Extraocular Movements: Extraocular movements intact.    Cardiovascular:     Rate and Rhythm: Normal rate and regular rhythm.  Pulmonary:     Effort: Pulmonary effort is normal.     Breath sounds: Normal breath sounds.  Abdominal:     General: Abdomen is flat. There is no distension.     Palpations: Abdomen is soft.     Tenderness: There is no abdominal tenderness.   Musculoskeletal:        General: Normal range  of motion.     Cervical back: Normal range of motion.   Skin:    General: Skin is warm and dry.   Neurological:     Mental Status: He is alert and oriented to person, place, and time.   Psychiatric:        Mood and Affect: Mood normal.        Behavior: Behavior normal.        Judgment: Judgment normal.      Assessment and Plan   Health Maintenance counseling: 1. Anticipatory guidance: Patient counseled regarding regular dental exams q6 months, eye exams yearly, avoiding smoking and second hand smoke, limiting alcohol to 2 beverages per day.   2. Risk factor reduction:  Advised patient of need for regular exercise and diet rich in fruits and vegetables to reduce risk of heart attack and stroke. Exercise- daily.   Wt Readings from Last 3 Encounters:  04/05/24 182 lb 9.6 oz (82.8 kg)  02/03/24 187 lb 6.4 oz (85 kg)  06/11/23 188 lb 6.4 oz (85.5 kg)   3. Immunizations/screenings/ancillary studies Immunization History  Administered Date(s) Administered   Influenza Split 08/29/2012   Tdap 03/20/2014, 04/05/2024   Zoster Recombinant(Shingrix ) 04/05/2023, 06/07/2023   Health Maintenance Due  Topic Date Due   Hepatitis B Vaccines (1 of 3 - 19+ 3-dose series) Never done    4. Prostate cancer screening >55yo - risk factors? checking today, no risk factors 5. Colon cancer screening: done 2023 6. Skin cancer screening-  advised regular sunscreen use. Denies worrisome, changing, or new skin lesions.  7. Smoking associated screening (lung cancer screening, AAA screen 65-75, UA)- non- smoker 8. STD screening - N/A 9. Alcohol screening: socially  Assessment & Plan Hyperlipidemia Elevated cholesterol, low cardiovascular risk. Regular exercise performed. Advised on dietary factors affecting cholesterol. Risk of arterial plaque due to elevated cholesterol. - Continue regular cardiovascular exercise. - Consider dietary modifications to reduce red meat and dairy intake. - Consider omega-3  supplementation with fish oil or krill oil. - F/U in 1 year  General Health Maintenance Due for Tdap booster. Received shingles vaccine due to family history. - Check CBC, CMP labs - Administer Tdap booster. - Advised on healthy diet, continued daily exercise, hydrate with 2L water daily.  Chronic bradycardia  Has had for years, exercises most days, continues to report feeling asymptomatic. - Advised to notify office if any change in symptoms including fatigue, lethargy, dizziness. - Will continue to monitor   Lab/Order associations: fasting    Lucius Krabbe, NP

## 2024-04-05 NOTE — Assessment & Plan Note (Signed)
 Elevated cholesterol, low cardiovascular risk. Regular exercise performed. Advised on dietary factors affecting cholesterol. Risk of arterial plaque due to elevated cholesterol. - Continue regular cardiovascular exercise. - Consider dietary modifications to reduce red meat and dairy intake. - Consider omega-3 supplementation with fish oil or krill oil. - F/U in 1 year

## 2024-04-07 ENCOUNTER — Ambulatory Visit: Payer: Self-pay | Admitting: Family

## 2024-05-17 ENCOUNTER — Ambulatory Visit: Admitting: Family

## 2024-05-17 ENCOUNTER — Encounter: Payer: Self-pay | Admitting: Family

## 2024-05-17 VITALS — BP 117/76 | HR 58 | Temp 98.2°F | Ht 70.0 in | Wt 184.0 lb

## 2024-05-17 DIAGNOSIS — H938X2 Other specified disorders of left ear: Secondary | ICD-10-CM

## 2024-05-17 DIAGNOSIS — J3489 Other specified disorders of nose and nasal sinuses: Secondary | ICD-10-CM | POA: Diagnosis not present

## 2024-05-17 MED ORDER — TRIAMCINOLONE ACETONIDE 55 MCG/ACT NA AERO
1.0000 | INHALATION_SPRAY | Freq: Every day | NASAL | 2 refills | Status: AC
Start: 2024-05-17 — End: ?

## 2024-05-17 NOTE — Progress Notes (Signed)
 Patient ID: HARRY SHUCK, male    DOB: 27-Jan-1971, 53 y.o.   MRN: [id number]  Chief Complaint  Patient presents with   Sinus Problem    Pt c/o left sided sinus pressure and left ear. Present for one month, Has tried allegra.   Discussed the use of AI scribe software for clinical note transcription with the patient, who gave verbal consent to proceed.  History of Present Illness CHETT TANIGUCHI is a 53 year old male who presents with persistent ear pressure and sinus congestion.  Aural pressure and sensation - Persistent left ear pressure described as outward, similar to sensation of water in the ear - Sensation is constant, with some days less noticeable - Ear feels hot and sometimes itchy - No significant pain or muffled hearing - No fever or pain with chewing - Symptoms present since before June - Occasionally uses Eustachian tube clearing techniques, which sometimes alleviate pressure - No history of frequent ear infections or ear trauma - Remote history of a piece of grass in the ear during high school  Nasal and sinus congestion - Partial sinus congestion on left side, not fully obstructed - No runny nose - Some drainage present, not colored - Throat mucus present  Allergy management - Occasional use of Allegra, approximately three or four times since June, not taken daily  Assessment & Plan Eustachian tube dysfunction with right ear pressure Persistent right ear pressure with dull eardrum, retraction, and no signs of infection. - Prescribed Nasacort  nasal spray, one squirt each side twice daily for three days, then once daily for a week, then qod or prn. - Instructed to perform Eustachian tube clearing techniques cautiously. - Advised to contact office if no improvement after a week, will send to ENT.  Allergic rhinitis (hay fever) Intermittent allergic rhinitis with partial nasal blockage and throat drainage. - Use Nasacort  nasal spray as prescribed for Eustachian  tube dysfunction. - Advised to discontinue Allegra while using Nasacort  to assess effectiveness.  Subjective:    Outpatient Medications Prior to Visit  Medication Sig Dispense Refill   Multiple Vitamins-Minerals (MULTIVITAMIN WITH MINERALS) tablet Take 1 tablet by mouth daily.     No facility-administered medications prior to visit.   Past Medical History:  Diagnosis Date   Allergy    Annual physical exam 07/31/2021   Asthma    mild, worse as teenager   Chest tightness 07/28/2011   Eczema    Dr. Ivin   Encounter to establish care 08/04/2021   H/O echocardiogram 10/12/2010   had chest pain/stress at that time, EKG abnormal; eval with echo and holter normal, cardiology, Dr. Court [facility]   Spermatocele of epididymis, single    right   Past Surgical History:  Procedure Laterality Date   APPENDECTOMY  10/13/1987   LACERATION REPAIR  10/13/1999   left forehead   OSTEOTOMY METACARPAL / FINGER Right    pinky   SKIN BIOPSY     Dr. Ivin   Allergies  Allergen Reactions   Neosporin [Neomycin-Bacitracin Zn-Polymyx]     rash      Objective:    Physical Exam Vitals and nursing note reviewed.  Constitutional:      General: He is not in acute distress.    Appearance: Normal appearance. He is not ill-appearing.  HENT:     Head: Normocephalic.     Right Ear: Ear canal normal. No drainage or tenderness. No middle ear effusion. Tympanic membrane is retracted (dull). Tympanic membrane is not injected  or erythematous.     Left Ear: Ear canal normal. No drainage or tenderness.  No middle ear effusion. Tympanic membrane is retracted (dull). Tympanic membrane is not injected or erythematous.     Nose:     Right Sinus: Frontal sinus tenderness present. No maxillary sinus tenderness.     Left Sinus: Frontal sinus tenderness present. No maxillary sinus tenderness.     Mouth/Throat:     Mouth: Mucous membranes are moist.     Pharynx: No pharyngeal swelling, oropharyngeal exudate,  posterior oropharyngeal erythema or uvula swelling.     Tonsils: No tonsillar exudate or tonsillar abscesses.  Cardiovascular:     Rate and Rhythm: Normal rate and regular rhythm.  Pulmonary:     Effort: Pulmonary effort is normal.     Breath sounds: Normal breath sounds.  Musculoskeletal:        General: Normal range of motion.     Cervical back: Normal range of motion.  Lymphadenopathy:     Head:     Right side of head: No preauricular or posterior auricular adenopathy.     Left side of head: No preauricular or posterior auricular adenopathy.     Cervical: No cervical adenopathy.  Skin:    General: Skin is warm and dry.  Neurological:     Mental Status: He is alert and oriented to person, place, and time.  Psychiatric:        Mood and Affect: Mood normal.    BP 117/76 (BP Location: Left Arm, Patient Position: Sitting, Cuff Size: Large)   Pulse (!) 58   Temp 98.2 F (36.8 C) (Temporal)   Ht 5' 10 (1.778 m)   Wt 184 lb (83.5 kg)   SpO2 97%   BMI 26.40 kg/m  Wt Readings from Last 3 Encounters:  05/17/24 184 lb (83.5 kg)  04/05/24 182 lb 9.6 oz (82.8 kg)  02/03/24 187 lb 6.4 oz (85 kg)      Lucius Krabbe, NP

## 2024-05-25 ENCOUNTER — Encounter: Payer: Self-pay | Admitting: Family

## 2024-05-25 DIAGNOSIS — H938X2 Other specified disorders of left ear: Secondary | ICD-10-CM

## 2024-05-25 DIAGNOSIS — J3489 Other specified disorders of nose and nasal sinuses: Secondary | ICD-10-CM

## 2024-05-25 NOTE — Telephone Encounter (Signed)
 yes, ok to send referral to [facility] and let Dia know, no visit needed thx.

## 2024-06-01 IMAGING — US US SCROTUM W/ DOPPLER COMPLETE
1 series · 14 of 25 positions shown · non-contrast
Comparison: None Available.

CLINICAL DATA: Left testicular pain.

EXAM:
SCROTAL ULTRASOUND
DOPPLER ULTRASOUND OF THE TESTICLES
TECHNIQUE: Complete ultrasound examination of the testicles, epididymis, and
other scrotal structures was performed. Color and spectral Doppler
ultrasound were also utilized to evaluate blood flow to the
testicles.

[Series 1: us scrotum doppler · 14 of 63 slices shown]
[im 1/63]
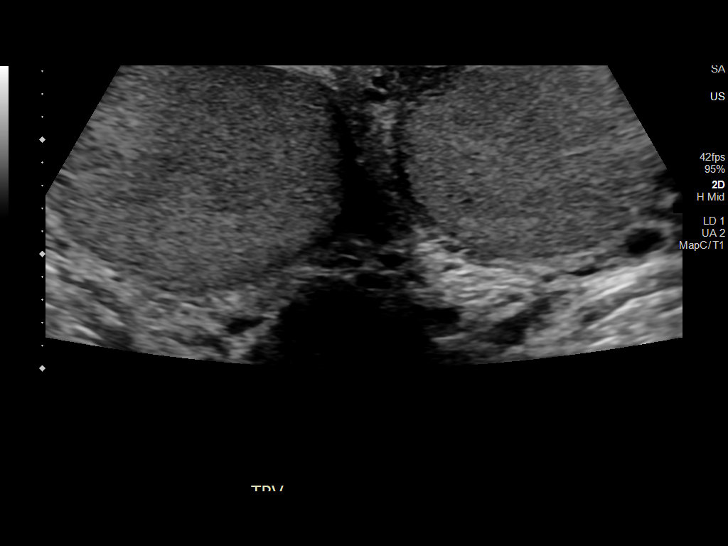
[im 6/63]
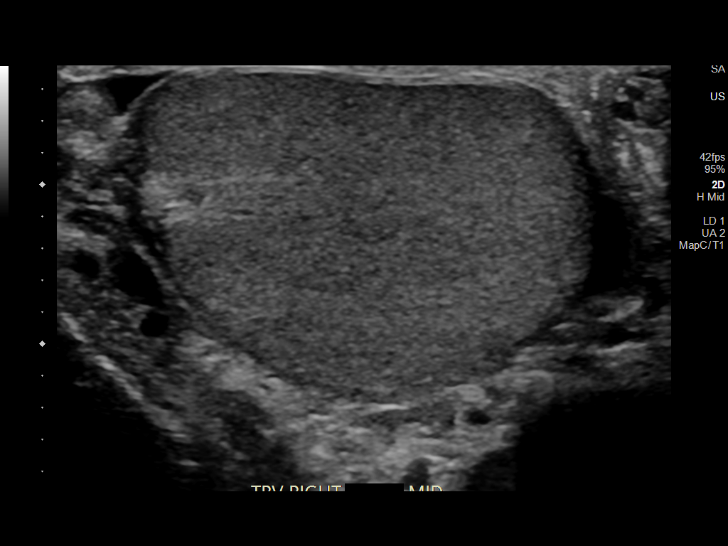
[im 11/63]
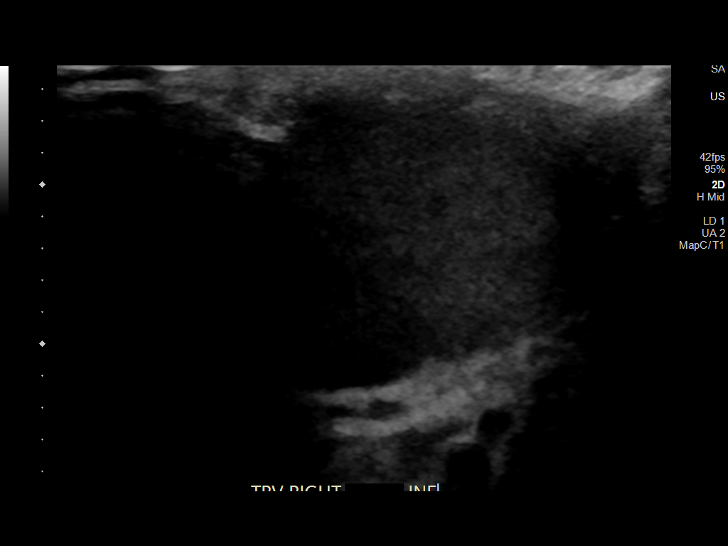
[im 16/63]
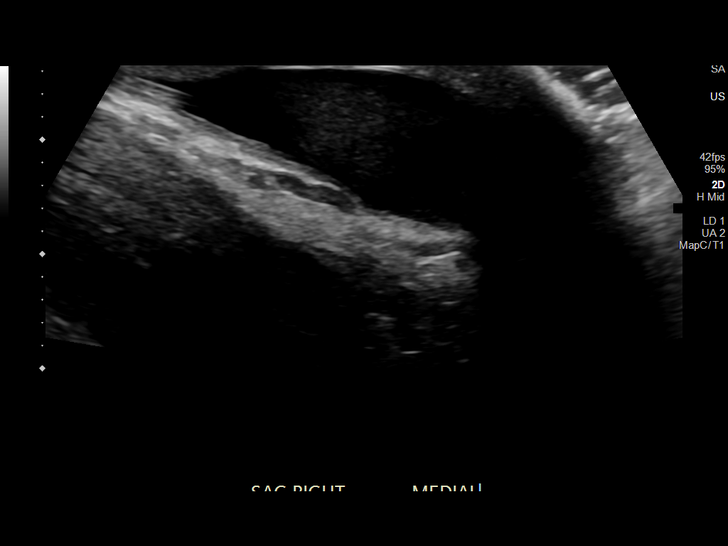
[im 21/63]
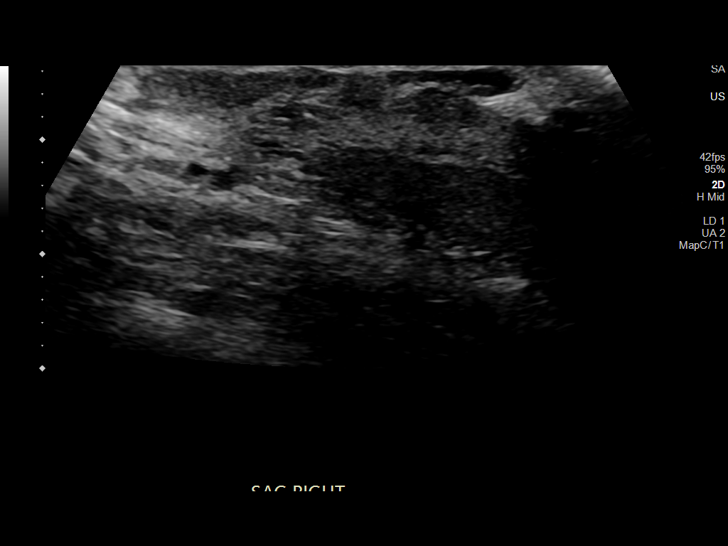
[im 24/63]
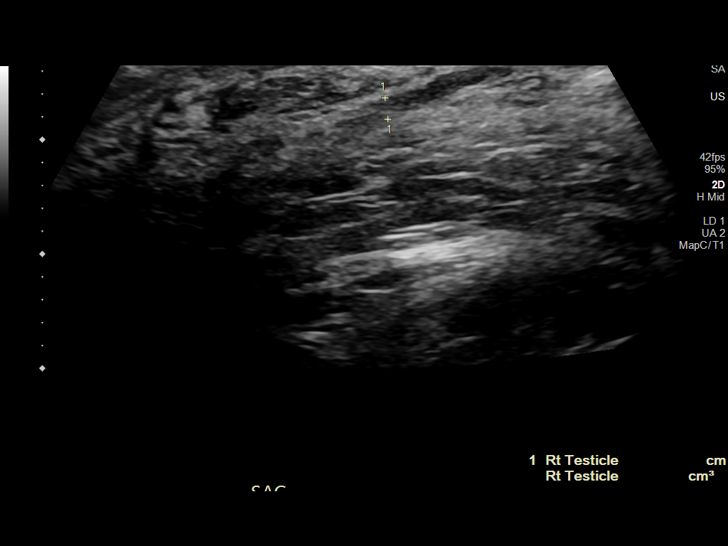
[im 29/63]
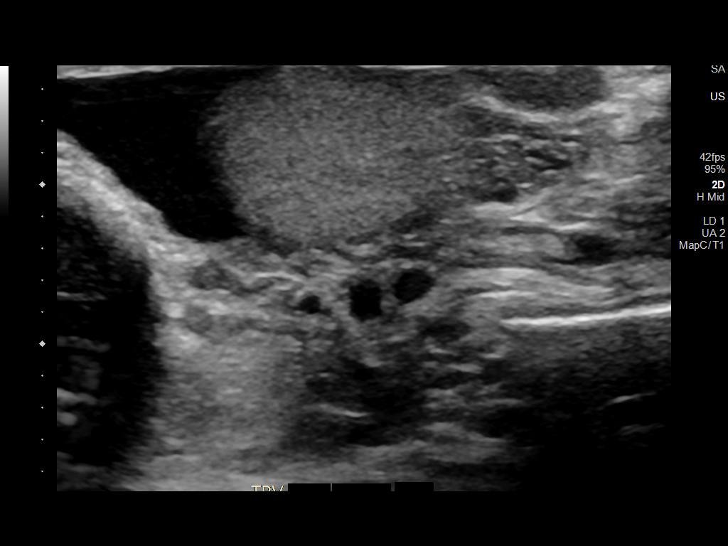
[im 34/63]
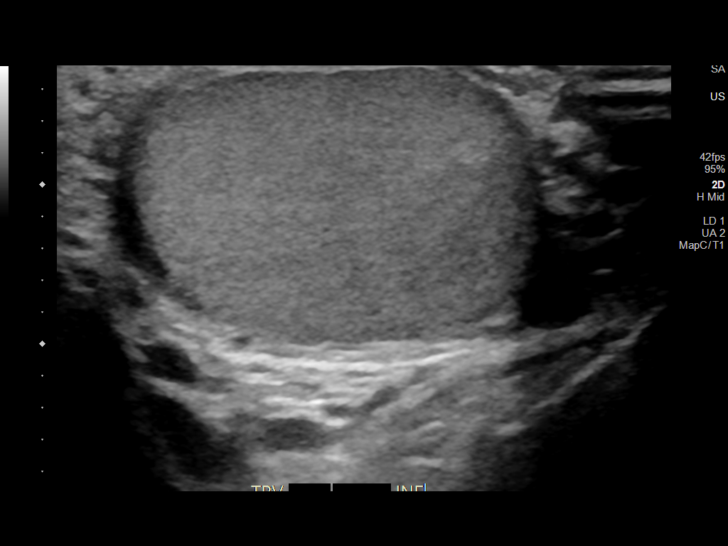
[im 39/63]
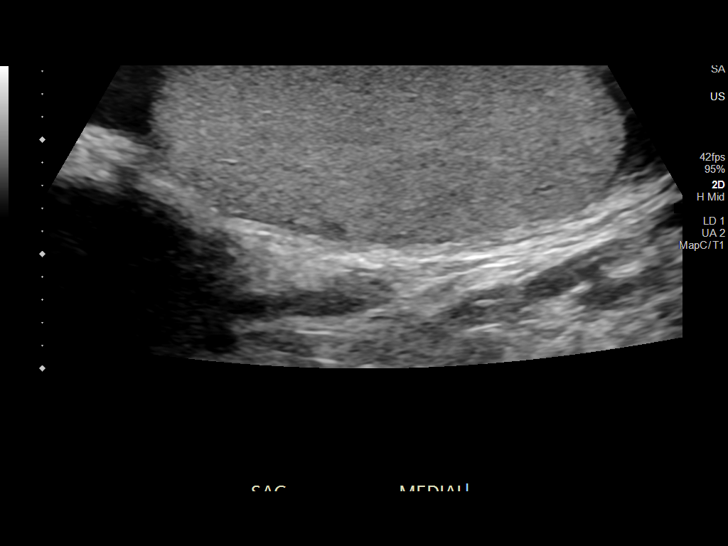
[im 42/63]
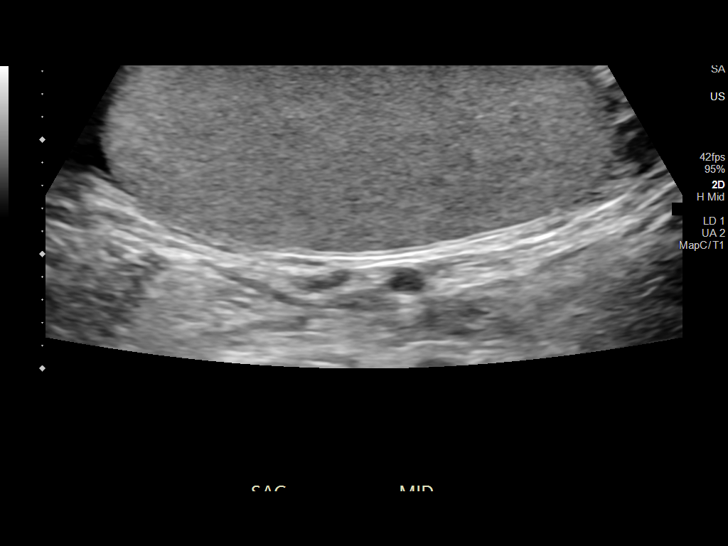
[im 47/63]
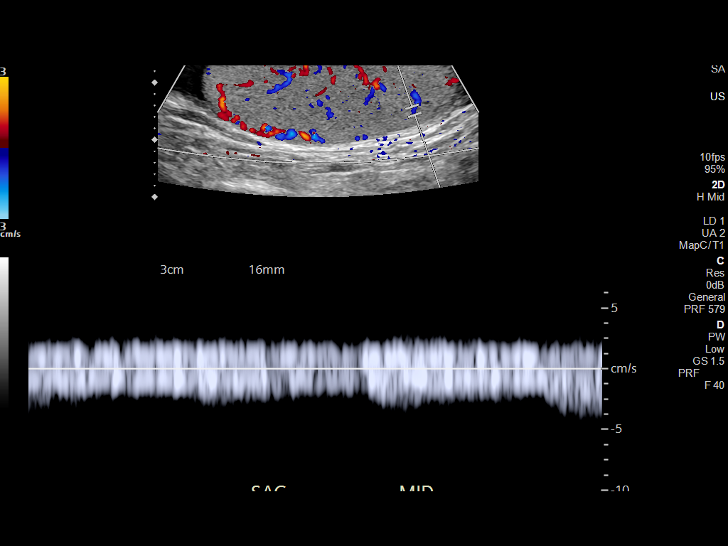
[im 52/63]
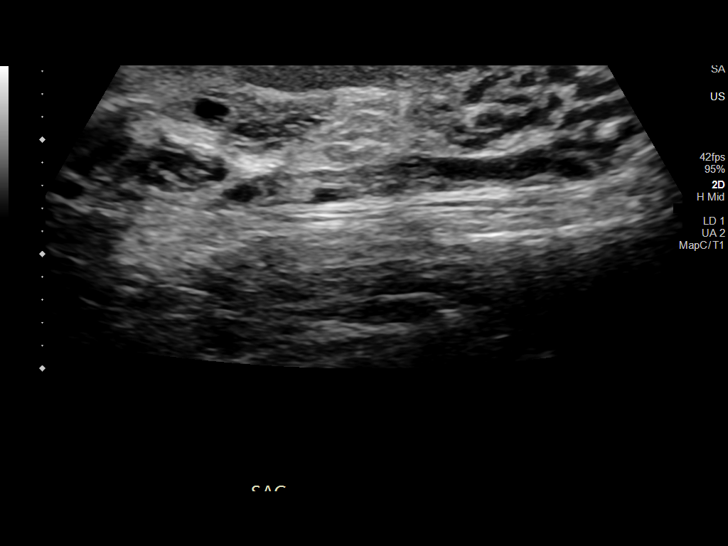
[im 57/63]
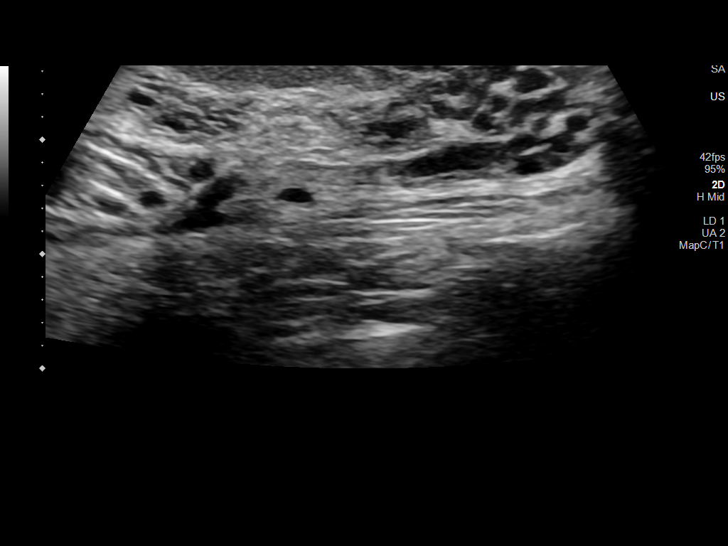
[im 63/63]
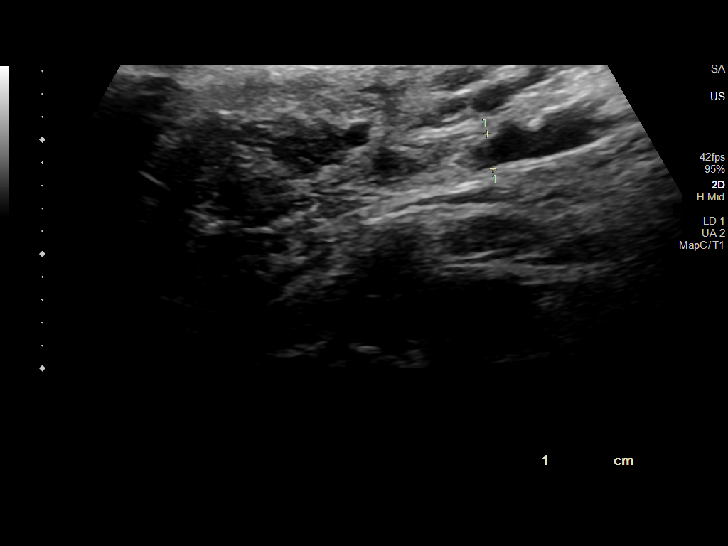

[14 of 25 positions shown; findings below may reference images not displayed]

FINDINGS: Right testicle

Measurements: 4.4 x 2.1 x 2.8 cm. No mass or microlithiasis
visualized.

Left testicle

Measurements: 4.6 x 1.6 x 2.8 cm. No mass or microlithiasis
visualized.

Right epididymis:  Normal in size and appearance.

Left epididymis:  Normal in size and appearance.

Hydrocele:  Small bilateral hydroceles.

Varicocele: Borderline mild left Valsalva with the venous plexus
caliber measuring up to 3 mm.

Pulsed Doppler interrogation of both testes demonstrates normal low
resistance arterial and venous waveforms bilaterally.
IMPRESSION: 1. Normal appearance of the bilateral testicles and epididymides.
2. Small bilateral hydroceles.
3. Borderline mild left varicocele.

## 2024-07-25 ENCOUNTER — Ambulatory Visit: Admitting: Family

## 2024-07-25 ENCOUNTER — Encounter: Payer: Self-pay | Admitting: Family

## 2024-07-25 VITALS — BP 120/70 | HR 63 | Temp 98.1°F | Ht 70.0 in | Wt 182.5 lb

## 2024-07-25 DIAGNOSIS — J358 Other chronic diseases of tonsils and adenoids: Secondary | ICD-10-CM | POA: Diagnosis not present

## 2024-07-25 NOTE — Progress Notes (Signed)
 Patient ID: Oscar Ellis, male    DOB: [birth year], 53 y.o.   MRN: [id number]  Chief Complaint  Patient presents with   Tonsil problem    Pt c/o lump on right tonsil, Noticed last week.  Pt denies any pain.   Discussed the use of AI scribe software for clinical note transcription with the patient, who gave verbal consent to proceed.  History of Present Illness Oscar Ellis is a 53 year old male who presents with a concern about a white area on his right tonsil.  He noticed a small, flat white area on his right tonsil, which is not sore, palpable, or mobile. He first observed it after a previous ENT visit where no abnormalities were noted. He attempted to remove it with a toothbrush but experienced gagging. There is no associated sore throat, pain, or growth of the area.  Assessment & Plan Tonsil stone (right tonsil) Possible tonsil stone on right tonsil, small, flat, white spot. No significant pain or sore throat. Tonsil stones are common, benign, may cause discomfort or foul taste when dislodged. - Advised using toothbrush or Q-tip to gently dislodge if bothersome. - Instructed to monitor for changes in size, pain, or symptoms. - Reassured tonsil stones are generally benign and self-limiting. - Advised to contact if symptoms worsen or concern about growth or pain.   Subjective:    Outpatient Medications Prior to Visit  Medication Sig Dispense Refill   Multiple Vitamins-Minerals (MULTIVITAMIN WITH MINERALS) tablet Take 1 tablet by mouth daily.     triamcinolone  (NASACORT ) 55 MCG/ACT AERO nasal inhaler Place 1 spray into the nose daily. Start with 1 spray each side twice a day for 3 days, then reduce to daily for 4 days, then qod or prn. 1 each 2   No facility-administered medications prior to visit.   Past Medical History:  Diagnosis Date   Allergy    Annual physical exam 07/31/2021   Asthma    mild, worse as teenager   Chest tightness 07/28/2011   Eczema    Dr. Ivin    Encounter to establish care 08/04/2021   H/O echocardiogram 10/12/2010   had chest pain/stress at that time, EKG abnormal; eval with echo and holter normal, cardiology, Dr. Court [facility]   Spermatocele of epididymis, single    right   Past Surgical History:  Procedure Laterality Date   APPENDECTOMY  10/13/1987   LACERATION REPAIR  10/13/1999   left forehead   OSTEOTOMY METACARPAL / FINGER Right    pinky   SKIN BIOPSY     Dr. Ivin   Allergies  Allergen Reactions   Neosporin [Neomycin-Bacitracin Zn-Polymyx]     rash      Objective:    Physical Exam Vitals and nursing note reviewed.  Constitutional:      General: He is not in acute distress.    Appearance: Normal appearance.  HENT:     Head: Normocephalic.     Mouth/Throat:     Mouth: Mucous membranes are moist.     Pharynx: Postnasal drip present. No pharyngeal swelling or oropharyngeal exudate.     Tonsils: No tonsillar exudate or tonsillar abscesses.     Comments: pinpoint size area of whitish substance on right tonsil Cardiovascular:     Rate and Rhythm: Normal rate and regular rhythm.  Pulmonary:     Effort: Pulmonary effort is normal.     Breath sounds: Normal breath sounds.  Musculoskeletal:        General: Normal  range of motion.     Cervical back: Normal range of motion.  Skin:    General: Skin is warm and dry.  Neurological:     Mental Status: He is alert and oriented to person, place, and time.  Psychiatric:        Mood and Affect: Mood normal.    BP 120/70 (BP Location: Left Arm, Patient Position: Sitting, Cuff Size: Large)   Pulse 63   Temp 98.1 F (36.7 C) (Temporal)   Ht 5' 10 (1.778 m)   Wt 182 lb 8 oz (82.8 kg)   SpO2 97%   BMI 26.19 kg/m  Wt Readings from Last 3 Encounters:  07/25/24 182 lb 8 oz (82.8 kg)  05/17/24 184 lb (83.5 kg)  04/05/24 182 lb 9.6 oz (82.8 kg)      Lucius Krabbe, NP
